# Patient Record
Sex: Male | Born: 1935 | Race: White | Hispanic: No | Marital: Married | Smoking: Never smoker
Health system: Southern US, Community
[De-identification: ages and names within clinical notes are randomized; demographics above are authoritative.]

## PROBLEM LIST (undated history)

## (undated) DIAGNOSIS — Z923 Personal history of irradiation: Secondary | ICD-10-CM

## (undated) DIAGNOSIS — C61 Malignant neoplasm of prostate: Secondary | ICD-10-CM

## (undated) HISTORY — PX: STOMACH SURGERY: SHX791

## (undated) HISTORY — PX: TONSILLECTOMY: SUR1361

## (undated) HISTORY — PX: TOOTH EXTRACTION: SUR596

## (undated) HISTORY — PX: WISDOM TOOTH EXTRACTION: SHX21

---

## 2002-11-25 ENCOUNTER — Ambulatory Visit: Admission: RE | Admit: 2002-11-25 | Discharge: 2003-02-23 | Payer: Self-pay | Admitting: Radiation Oncology

## 2003-01-09 ENCOUNTER — Encounter: Admission: RE | Admit: 2003-01-09 | Discharge: 2003-01-09 | Payer: Self-pay | Admitting: Urology

## 2003-01-09 ENCOUNTER — Encounter: Payer: Self-pay | Admitting: Urology

## 2003-02-12 ENCOUNTER — Ambulatory Visit (HOSPITAL_BASED_OUTPATIENT_CLINIC_OR_DEPARTMENT_OTHER): Admission: RE | Admit: 2003-02-12 | Discharge: 2003-02-12 | Payer: Self-pay | Admitting: Urology

## 2003-02-12 ENCOUNTER — Encounter: Payer: Self-pay | Admitting: Urology

## 2003-02-26 ENCOUNTER — Ambulatory Visit: Admission: RE | Admit: 2003-02-26 | Discharge: 2003-02-26 | Payer: Self-pay | Admitting: Radiation Oncology

## 2003-03-02 ENCOUNTER — Ambulatory Visit: Admission: RE | Admit: 2003-03-02 | Discharge: 2003-03-11 | Payer: Self-pay | Admitting: Radiation Oncology

## 2009-12-14 ENCOUNTER — Ambulatory Visit: Payer: Self-pay | Admitting: Internal Medicine

## 2009-12-14 ENCOUNTER — Inpatient Hospital Stay (HOSPITAL_COMMUNITY): Admission: EM | Admit: 2009-12-14 | Discharge: 2009-12-29 | Payer: Self-pay | Admitting: Emergency Medicine

## 2009-12-14 ENCOUNTER — Ambulatory Visit: Payer: Self-pay | Admitting: Pulmonary Disease

## 2009-12-15 ENCOUNTER — Ambulatory Visit: Payer: Self-pay | Admitting: Vascular Surgery

## 2009-12-15 ENCOUNTER — Encounter (INDEPENDENT_AMBULATORY_CARE_PROVIDER_SITE_OTHER): Payer: Self-pay | Admitting: Internal Medicine

## 2009-12-17 ENCOUNTER — Encounter (INDEPENDENT_AMBULATORY_CARE_PROVIDER_SITE_OTHER): Payer: Self-pay | Admitting: Internal Medicine

## 2010-02-14 ENCOUNTER — Inpatient Hospital Stay (HOSPITAL_COMMUNITY): Admission: EM | Admit: 2010-02-14 | Discharge: 2010-02-20 | Payer: Self-pay | Admitting: Emergency Medicine

## 2010-02-23 ENCOUNTER — Encounter: Admission: RE | Admit: 2010-02-23 | Discharge: 2010-02-23 | Payer: Self-pay | Admitting: General Surgery

## 2010-02-25 ENCOUNTER — Emergency Department (HOSPITAL_COMMUNITY): Admission: EM | Admit: 2010-02-25 | Discharge: 2010-02-26 | Payer: Self-pay | Admitting: Emergency Medicine

## 2010-03-16 ENCOUNTER — Ambulatory Visit (HOSPITAL_COMMUNITY): Admission: RE | Admit: 2010-03-16 | Discharge: 2010-03-16 | Payer: Self-pay | Admitting: Gastroenterology

## 2010-03-28 ENCOUNTER — Encounter: Admission: RE | Admit: 2010-03-28 | Discharge: 2010-03-28 | Payer: Self-pay | Admitting: Gastroenterology

## 2010-04-01 ENCOUNTER — Inpatient Hospital Stay (HOSPITAL_COMMUNITY): Admission: RE | Admit: 2010-04-01 | Discharge: 2010-04-08 | Payer: Self-pay | Admitting: General Surgery

## 2010-04-01 ENCOUNTER — Encounter (INDEPENDENT_AMBULATORY_CARE_PROVIDER_SITE_OTHER): Payer: Self-pay | Admitting: General Surgery

## 2010-12-03 LAB — COMPREHENSIVE METABOLIC PANEL
Alkaline Phosphatase: 72 U/L (ref 39–117)
BUN: 6 mg/dL (ref 6–23)
Calcium: 8.1 mg/dL — ABNORMAL LOW (ref 8.4–10.5)
Glucose, Bld: 131 mg/dL — ABNORMAL HIGH (ref 70–99)
Potassium: 3.4 mEq/L — ABNORMAL LOW (ref 3.5–5.1)
Total Protein: 6 g/dL (ref 6.0–8.3)

## 2010-12-03 LAB — DIFFERENTIAL
Basophils Absolute: 0.1 10*3/uL (ref 0.0–0.1)
Basophils Relative: 1 % (ref 0–1)
Monocytes Relative: 8 % (ref 3–12)
Neutro Abs: 5.9 10*3/uL (ref 1.7–7.7)
Neutrophils Relative %: 73 % (ref 43–77)

## 2010-12-03 LAB — CBC
Platelets: 266 10*3/uL (ref 150–400)
RDW: 14.1 % (ref 11.5–15.5)

## 2010-12-04 LAB — BASIC METABOLIC PANEL
GFR calc Af Amer: >60 mL/min (ref >60)
GFR calc non Af Amer: >60 mL/min (ref >60)
Glucose, Bld: 127 mg/dL — ABNORMAL HIGH (ref 70–99)
Potassium: 4 mEq/L (ref 3.5–5.1)
Sodium: 134 mEq/L — ABNORMAL LOW (ref 135–145)

## 2010-12-04 LAB — SURGICAL PCR SCREEN: MRSA, PCR: POSITIVE — AB

## 2010-12-04 LAB — CBC
HCT: 29.9 % — ABNORMAL LOW (ref 39.0–52.0)
Hemoglobin: 10.1 g/dL — ABNORMAL LOW (ref 13.0–17.0)
RBC: 3.4 MIL/uL — ABNORMAL LOW (ref 4.22–5.81)
WBC: 7.9 10*3/uL (ref 4.0–10.5)

## 2010-12-05 LAB — URINALYSIS, ROUTINE W REFLEX MICROSCOPIC
Glucose, UA: NEGATIVE mg/dL
Glucose, UA: 100 mg/dL — AB
Hgb urine dipstick: NEGATIVE
Ketones, ur: NEGATIVE mg/dL
Leukocytes, UA: NEGATIVE
Nitrite: NEGATIVE
pH: 6 (ref 5.0–8.0)
pH: 6.5 (ref 5.0–8.0)

## 2010-12-05 LAB — DIFFERENTIAL
Basophils Absolute: 0 10*3/uL (ref 0.0–0.1)
Basophils Relative: 1 % (ref 0–1)
Eosinophils Relative: 0 % (ref 0–5)
Lymphocytes Relative: 11 % — ABNORMAL LOW (ref 12–46)
Lymphocytes Relative: 11 % — ABNORMAL LOW (ref 12–46)
Lymphs Abs: 1.5 10*3/uL (ref 0.7–4.0)
Lymphs Abs: 1.5 10*3/uL (ref 0.7–4.0)
Monocytes Absolute: 0.4 10*3/uL (ref 0.1–1.0)
Monocytes Absolute: 0.8 10*3/uL (ref 0.1–1.0)
Monocytes Absolute: 0.9 10*3/uL (ref 0.1–1.0)
Monocytes Relative: 5 % (ref 3–12)
Monocytes Relative: 7 % (ref 3–12)
Neutro Abs: 11.3 10*3/uL — ABNORMAL HIGH (ref 1.7–7.7)
Neutro Abs: 11 10*3/uL — ABNORMAL HIGH (ref 1.7–7.7)
Neutro Abs: 14.2 10*3/uL — ABNORMAL HIGH (ref 1.7–7.7)

## 2010-12-05 LAB — COMPREHENSIVE METABOLIC PANEL
ALT: 14 U/L (ref 0–53)
ALT: 14 U/L (ref 0–53)
ALT: 15 U/L (ref 0–53)
AST: 20 U/L (ref 0–37)
AST: 23 U/L (ref 0–37)
AST: 25 U/L (ref 0–37)
Albumin: 2.1 g/dL — ABNORMAL LOW (ref 3.5–5.2)
Albumin: 2.4 g/dL — ABNORMAL LOW (ref 3.5–5.2)
Albumin: 3.2 g/dL — ABNORMAL LOW (ref 3.5–5.2)
Alkaline Phosphatase: 63 U/L (ref 39–117)
Alkaline Phosphatase: 70 U/L (ref 39–117)
BUN: 12 mg/dL (ref 6–23)
BUN: 24 mg/dL — ABNORMAL HIGH (ref 6–23)
BUN: 18 mg/dL (ref 6–23)
BUN: 18 mg/dL (ref 6–23)
CO2: 22 mEq/L (ref 19–32)
CO2: 22 mEq/L (ref 19–32)
Calcium: 7.8 mg/dL — ABNORMAL LOW (ref 8.4–10.5)
Calcium: 8 mg/dL — ABNORMAL LOW (ref 8.4–10.5)
Chloride: 108 mEq/L (ref 96–112)
Chloride: 110 mEq/L (ref 96–112)
Creatinine, Ser: 0.79 mg/dL (ref 0.4–1.5)
Creatinine, Ser: 0.95 mg/dL (ref 0.4–1.5)
GFR calc Af Amer: >60 mL/min (ref >60)
GFR calc Af Amer: >60 mL/min (ref >60)
GFR calc Af Amer: >60 mL/min (ref >60)
GFR calc non Af Amer: >60 mL/min (ref >60)
GFR calc non Af Amer: >60 mL/min (ref >60)
Glucose, Bld: 106 mg/dL — ABNORMAL HIGH (ref 70–99)
Glucose, Bld: 98 mg/dL (ref 70–99)
Potassium: 3.6 mEq/L (ref 3.5–5.1)
Potassium: 3.8 mEq/L (ref 3.5–5.1)
Sodium: 139 mEq/L (ref 135–145)
Sodium: 140 mEq/L (ref 135–145)
Sodium: 137 mEq/L (ref 135–145)
Total Bilirubin: 1.5 mg/dL — ABNORMAL HIGH (ref 0.3–1.2)
Total Protein: 6.1 g/dL (ref 6.0–8.3)
Total Protein: 5.8 g/dL — ABNORMAL LOW (ref 6.0–8.3)
Total Protein: 7.3 g/dL (ref 6.0–8.3)

## 2010-12-05 LAB — CBC
HCT: 26.8 % — ABNORMAL LOW (ref 39.0–52.0)
HCT: 27.8 % — ABNORMAL LOW (ref 39.0–52.0)
HCT: 29.9 % — ABNORMAL LOW (ref 39.0–52.0)
HCT: 29.2 % — ABNORMAL LOW (ref 39.0–52.0)
Hemoglobin: 10.4 g/dL — ABNORMAL LOW (ref 13.0–17.0)
Hemoglobin: 9.2 g/dL — ABNORMAL LOW (ref 13.0–17.0)
Hemoglobin: 9.5 g/dL — ABNORMAL LOW (ref 13.0–17.0)
MCHC: 34.3 g/dL (ref 30.0–36.0)
MCHC: 34.5 g/dL (ref 30.0–36.0)
MCV: 92 fL (ref 78.0–100.0)
MCV: 91.9 fL (ref 78.0–100.0)
Platelets: 151 10*3/uL (ref 150–400)
Platelets: 162 10*3/uL (ref 150–400)
Platelets: 393 10*3/uL (ref 150–400)
Platelets: 167 10*3/uL (ref 150–400)
Platelets: 235 10*3/uL (ref 150–400)
RBC: 2.95 MIL/uL — ABNORMAL LOW (ref 4.22–5.81)
RBC: 3.02 MIL/uL — ABNORMAL LOW (ref 4.22–5.81)
RDW: 12.9 % (ref 11.5–15.5)
RDW: 13.7 % (ref 11.5–15.5)
RDW: 12.9 % (ref 11.5–15.5)
RDW: 12.9 % (ref 11.5–15.5)
WBC: 11.4 10*3/uL — ABNORMAL HIGH (ref 4.0–10.5)
WBC: 13.3 10*3/uL — ABNORMAL HIGH (ref 4.0–10.5)
WBC: 8.1 10*3/uL (ref 4.0–10.5)

## 2010-12-05 LAB — URINE CULTURE: Colony Count: NO GROWTH

## 2010-12-05 LAB — BASIC METABOLIC PANEL
BUN: 18 mg/dL (ref 6–23)
CO2: 21 mEq/L (ref 19–32)
CO2: 26 mEq/L (ref 19–32)
Chloride: 101 mEq/L (ref 96–112)
GFR calc Af Amer: >60 mL/min (ref >60)
GFR calc non Af Amer: >60 mL/min (ref >60)
Glucose, Bld: 102 mg/dL — ABNORMAL HIGH (ref 70–99)
Potassium: 3 mEq/L — ABNORMAL LOW (ref 3.5–5.1)
Potassium: 3.2 mEq/L — ABNORMAL LOW (ref 3.5–5.1)
Sodium: 137 mEq/L (ref 135–145)

## 2010-12-05 LAB — LIPASE, BLOOD: Lipase: 74 U/L — ABNORMAL HIGH (ref 11–59)

## 2010-12-05 LAB — LIPID PANEL
HDL: 22 mg/dL — ABNORMAL LOW (ref >39)
Total CHOL/HDL Ratio: 3 RATIO
Triglycerides: 46 mg/dL (ref <150)
VLDL: 9 mg/dL (ref 0–40)

## 2010-12-05 LAB — CULTURE, BLOOD (ROUTINE X 2): Culture: NO GROWTH

## 2010-12-05 LAB — URINE MICROSCOPIC-ADD ON

## 2010-12-07 LAB — GLUCOSE, CAPILLARY
Glucose-Capillary: 103 mg/dL — ABNORMAL HIGH (ref 70–99)
Glucose-Capillary: 110 mg/dL — ABNORMAL HIGH (ref 70–99)
Glucose-Capillary: 110 mg/dL — ABNORMAL HIGH (ref 70–99)
Glucose-Capillary: 111 mg/dL — ABNORMAL HIGH (ref 70–99)
Glucose-Capillary: 116 mg/dL — ABNORMAL HIGH (ref 70–99)
Glucose-Capillary: 117 mg/dL — ABNORMAL HIGH (ref 70–99)
Glucose-Capillary: 119 mg/dL — ABNORMAL HIGH (ref 70–99)
Glucose-Capillary: 122 mg/dL — ABNORMAL HIGH (ref 70–99)
Glucose-Capillary: 134 mg/dL — ABNORMAL HIGH (ref 70–99)
Glucose-Capillary: 139 mg/dL — ABNORMAL HIGH (ref 70–99)
Glucose-Capillary: 160 mg/dL — ABNORMAL HIGH (ref 70–99)
Glucose-Capillary: 166 mg/dL — ABNORMAL HIGH (ref 70–99)
Glucose-Capillary: 166 mg/dL — ABNORMAL HIGH (ref 70–99)
Glucose-Capillary: 170 mg/dL — ABNORMAL HIGH (ref 70–99)
Glucose-Capillary: 186 mg/dL — ABNORMAL HIGH (ref 70–99)
Glucose-Capillary: 190 mg/dL — ABNORMAL HIGH (ref 70–99)
Glucose-Capillary: 191 mg/dL — ABNORMAL HIGH (ref 70–99)
Glucose-Capillary: 195 mg/dL — ABNORMAL HIGH (ref 70–99)
Glucose-Capillary: 199 mg/dL — ABNORMAL HIGH (ref 70–99)
Glucose-Capillary: 214 mg/dL — ABNORMAL HIGH (ref 70–99)
Glucose-Capillary: 241 mg/dL — ABNORMAL HIGH (ref 70–99)
Glucose-Capillary: 272 mg/dL — ABNORMAL HIGH (ref 70–99)
Glucose-Capillary: 70 mg/dL (ref 70–99)

## 2010-12-07 LAB — BASIC METABOLIC PANEL
BUN: 21 mg/dL (ref 6–23)
BUN: 47 mg/dL — ABNORMAL HIGH (ref 6–23)
CO2: 23 mEq/L (ref 19–32)
CO2: 25 mEq/L (ref 19–32)
Calcium: 7.1 mg/dL — ABNORMAL LOW (ref 8.4–10.5)
Calcium: 7.2 mg/dL — ABNORMAL LOW (ref 8.4–10.5)
Calcium: 7.3 mg/dL — ABNORMAL LOW (ref 8.4–10.5)
Calcium: 7.3 mg/dL — ABNORMAL LOW (ref 8.4–10.5)
Chloride: 111 mEq/L (ref 96–112)
Chloride: 115 mEq/L — ABNORMAL HIGH (ref 96–112)
Chloride: 116 mEq/L — ABNORMAL HIGH (ref 96–112)
Creatinine, Ser: 1.27 mg/dL (ref 0.4–1.5)
Creatinine, Ser: 1.31 mg/dL (ref 0.4–1.5)
Creatinine, Ser: 1.54 mg/dL — ABNORMAL HIGH (ref 0.4–1.5)
Creatinine, Ser: 1.75 mg/dL — ABNORMAL HIGH (ref 0.4–1.5)
GFR calc Af Amer: 46 mL/min — ABNORMAL LOW (ref >60)
GFR calc Af Amer: 52 mL/min — ABNORMAL LOW (ref >60)
GFR calc Af Amer: 54 mL/min — ABNORMAL LOW (ref >60)
GFR calc Af Amer: 56 mL/min — ABNORMAL LOW (ref >60)
GFR calc Af Amer: 59 mL/min — ABNORMAL LOW (ref >60)
GFR calc Af Amer: >60 mL/min (ref >60)
GFR calc non Af Amer: 44 mL/min — ABNORMAL LOW (ref >60)
GFR calc non Af Amer: 46 mL/min — ABNORMAL LOW (ref >60)
GFR calc non Af Amer: 49 mL/min — ABNORMAL LOW (ref >60)
GFR calc non Af Amer: 55 mL/min — ABNORMAL LOW (ref >60)
Glucose, Bld: 153 mg/dL — ABNORMAL HIGH (ref 70–99)
Glucose, Bld: 157 mg/dL — ABNORMAL HIGH (ref 70–99)
Glucose, Bld: 163 mg/dL — ABNORMAL HIGH (ref 70–99)
Glucose, Bld: 205 mg/dL — ABNORMAL HIGH (ref 70–99)
Glucose, Bld: 267 mg/dL — ABNORMAL HIGH (ref 70–99)
Potassium: 3.1 mEq/L — ABNORMAL LOW (ref 3.5–5.1)
Potassium: 3.1 mEq/L — ABNORMAL LOW (ref 3.5–5.1)
Potassium: 3.4 mEq/L — ABNORMAL LOW (ref 3.5–5.1)
Sodium: 144 mEq/L (ref 135–145)
Sodium: 145 mEq/L (ref 135–145)

## 2010-12-07 LAB — CBC
HCT: 35.3 % — ABNORMAL LOW (ref 39.0–52.0)
HCT: 39.9 % (ref 39.0–52.0)
Hemoglobin: 12.4 g/dL — ABNORMAL LOW (ref 13.0–17.0)
Hemoglobin: 13.2 g/dL (ref 13.0–17.0)
Hemoglobin: 13.9 g/dL (ref 13.0–17.0)
MCHC: 33.6 g/dL (ref 30.0–36.0)
MCHC: 33.6 g/dL (ref 30.0–36.0)
MCHC: 33.8 g/dL (ref 30.0–36.0)
MCHC: 34 g/dL (ref 30.0–36.0)
MCHC: 34.6 g/dL (ref 30.0–36.0)
MCV: 93.3 fL (ref 78.0–100.0)
MCV: 93.6 fL (ref 78.0–100.0)
MCV: 93.7 fL (ref 78.0–100.0)
MCV: 94.1 fL (ref 78.0–100.0)
MCV: 95.3 fL (ref 78.0–100.0)
Platelets: 213 10*3/uL (ref 150–400)
Platelets: 221 10*3/uL (ref 150–400)
Platelets: 237 10*3/uL (ref 150–400)
Platelets: 292 10*3/uL (ref 150–400)
RBC: 3.74 MIL/uL — ABNORMAL LOW (ref 4.22–5.81)
RBC: 3.77 MIL/uL — ABNORMAL LOW (ref 4.22–5.81)
RBC: 3.95 MIL/uL — ABNORMAL LOW (ref 4.22–5.81)
RBC: 3.97 MIL/uL — ABNORMAL LOW (ref 4.22–5.81)
RBC: 4.16 MIL/uL — ABNORMAL LOW (ref 4.22–5.81)
RBC: 4.26 MIL/uL (ref 4.22–5.81)
RBC: 4.69 MIL/uL (ref 4.22–5.81)
RBC: 4.99 MIL/uL (ref 4.22–5.81)
RDW: 13.2 % (ref 11.5–15.5)
RDW: 13.5 % (ref 11.5–15.5)
RDW: 13.6 % (ref 11.5–15.5)
RDW: 13.6 % (ref 11.5–15.5)
RDW: 13.7 % (ref 11.5–15.5)
RDW: 13.9 % (ref 11.5–15.5)
RDW: 14.1 % (ref 11.5–15.5)
WBC: 15.9 10*3/uL — ABNORMAL HIGH (ref 4.0–10.5)
WBC: 23.1 10*3/uL — ABNORMAL HIGH (ref 4.0–10.5)
WBC: 24 10*3/uL — ABNORMAL HIGH (ref 4.0–10.5)
WBC: 31.5 10*3/uL — ABNORMAL HIGH (ref 4.0–10.5)

## 2010-12-07 LAB — CLOSTRIDIUM DIFFICILE EIA: C difficile Toxins A+B, EIA: NEGATIVE

## 2010-12-07 LAB — COMPREHENSIVE METABOLIC PANEL
ALT: 33 U/L (ref 0–53)
AST: 28 U/L (ref 0–37)
AST: 38 U/L — ABNORMAL HIGH (ref 0–37)
Alkaline Phosphatase: 111 U/L (ref 39–117)
Alkaline Phosphatase: 93 U/L (ref 39–117)
BUN: 14 mg/dL (ref 6–23)
CO2: 27 mEq/L (ref 19–32)
CO2: 27 mEq/L (ref 19–32)
CO2: 27 mEq/L (ref 19–32)
Calcium: 7.1 mg/dL — ABNORMAL LOW (ref 8.4–10.5)
Calcium: 7.7 mg/dL — ABNORMAL LOW (ref 8.4–10.5)
Chloride: 105 mEq/L (ref 96–112)
Chloride: 108 mEq/L (ref 96–112)
Creatinine, Ser: 1.37 mg/dL (ref 0.4–1.5)
GFR calc Af Amer: >60 mL/min (ref >60)
GFR calc Af Amer: >60 mL/min (ref >60)
GFR calc non Af Amer: 51 mL/min — ABNORMAL LOW (ref >60)
GFR calc non Af Amer: 53 mL/min — ABNORMAL LOW (ref >60)
GFR calc non Af Amer: 56 mL/min — ABNORMAL LOW (ref >60)
Glucose, Bld: 110 mg/dL — ABNORMAL HIGH (ref 70–99)
Glucose, Bld: 94 mg/dL (ref 70–99)
Potassium: 2.8 mEq/L — ABNORMAL LOW (ref 3.5–5.1)
Potassium: 3.2 mEq/L — ABNORMAL LOW (ref 3.5–5.1)
Sodium: 138 mEq/L (ref 135–145)
Sodium: 140 mEq/L (ref 135–145)
Total Bilirubin: 0.9 mg/dL (ref 0.3–1.2)
Total Bilirubin: 1 mg/dL (ref 0.3–1.2)
Total Protein: 5.3 g/dL — ABNORMAL LOW (ref 6.0–8.3)

## 2010-12-07 LAB — URINALYSIS, ROUTINE W REFLEX MICROSCOPIC
Bilirubin Urine: NEGATIVE
Glucose, UA: NEGATIVE mg/dL
Nitrite: NEGATIVE
Protein, ur: 30 mg/dL — AB
Specific Gravity, Urine: 1.026 (ref 1.005–1.030)
Urobilinogen, UA: 0.2 mg/dL (ref 0.0–1.0)
pH: 5.5 (ref 5.0–8.0)

## 2010-12-07 LAB — CARDIAC PANEL(CRET KIN+CKTOT+MB+TROPI)
CK, MB: 2.1 ng/mL (ref 0.3–4.0)
CK, MB: 2.1 ng/mL (ref 0.3–4.0)
Relative Index: 1.5 (ref 0.0–2.5)
Relative Index: 1.9 (ref 0.0–2.5)
Total CK: 113 U/L (ref 7–232)
Total CK: 149 U/L (ref 7–232)
Troponin I: 0.02 ng/mL (ref 0.00–0.06)
Troponin I: 0.03 ng/mL (ref 0.00–0.06)
Troponin I: 0.03 ng/mL (ref 0.00–0.06)
Troponin I: 0.04 ng/mL (ref 0.00–0.06)

## 2010-12-07 LAB — URINE MICROSCOPIC-ADD ON

## 2010-12-07 LAB — URINE CULTURE
Culture: NO GROWTH
Special Requests: NEGATIVE

## 2010-12-07 LAB — HEPATIC FUNCTION PANEL
AST: 24 U/L (ref 0–37)
Albumin: 2 g/dL — ABNORMAL LOW (ref 3.5–5.2)
Albumin: 2.3 g/dL — ABNORMAL LOW (ref 3.5–5.2)
Total Bilirubin: 1 mg/dL (ref 0.3–1.2)
Total Protein: 5.5 g/dL — ABNORMAL LOW (ref 6.0–8.3)

## 2010-12-07 LAB — MAGNESIUM
Magnesium: 1.9 mg/dL (ref 1.5–2.5)
Magnesium: 2.2 mg/dL (ref 1.5–2.5)
Magnesium: 2.2 mg/dL (ref 1.5–2.5)

## 2010-12-07 LAB — OSMOLALITY, URINE: Osmolality, Ur: 641 mOsm/kg (ref 390–1090)

## 2010-12-07 LAB — SODIUM, URINE, RANDOM
Sodium, Ur: 13 mEq/L
Sodium, Ur: 15 mEq/L

## 2010-12-07 LAB — BRAIN NATRIURETIC PEPTIDE: Pro B Natriuretic peptide (BNP): <30 pg/mL (ref 0.0–100.0)

## 2010-12-07 LAB — POTASSIUM: Potassium: 3.1 mEq/L — ABNORMAL LOW (ref 3.5–5.1)

## 2010-12-07 LAB — CREATININE, URINE, RANDOM: Creatinine, Urine: 112.4 mg/dL

## 2010-12-07 LAB — MRSA PCR SCREENING: MRSA by PCR: NEGATIVE

## 2010-12-07 LAB — AMYLASE: Amylase: 70 U/L (ref 0–105)

## 2010-12-07 LAB — LIPASE, BLOOD
Lipase: 36 U/L (ref 11–59)
Lipase: 43 U/L (ref 11–59)

## 2010-12-07 LAB — HEMOGLOBIN A1C: Hgb A1c MFr Bld: 6.8 % — ABNORMAL HIGH (ref 4.6–6.1)

## 2010-12-11 LAB — PROTIME-INR: INR: 1.08 (ref 0.00–1.49)

## 2010-12-11 LAB — POCT I-STAT, CHEM 8
BUN: 14 mg/dL (ref 6–23)
Creatinine, Ser: 1.2 mg/dL (ref 0.4–1.5)
Glucose, Bld: 195 mg/dL — ABNORMAL HIGH (ref 70–99)
Sodium: 141 mEq/L (ref 135–145)
TCO2: 28 mmol/L (ref 0–100)

## 2010-12-11 LAB — DIFFERENTIAL
Basophils Relative: 0 % (ref 0–1)
Basophils Relative: 0 % (ref 0–1)
Eosinophils Absolute: 0 10*3/uL (ref 0.0–0.7)
Eosinophils Absolute: 0 10*3/uL (ref 0.0–0.7)
Lymphs Abs: 1.4 10*3/uL (ref 0.7–4.0)
Monocytes Absolute: 0.5 10*3/uL (ref 0.1–1.0)
Monocytes Relative: 4 % (ref 3–12)
Monocytes Relative: 5 % (ref 3–12)
Neutrophils Relative %: 86 % — ABNORMAL HIGH (ref 43–77)
Neutrophils Relative %: 92 % — ABNORMAL HIGH (ref 43–77)

## 2010-12-11 LAB — COMPREHENSIVE METABOLIC PANEL
ALT: 215 U/L — ABNORMAL HIGH (ref 0–53)
ALT: 322 U/L — ABNORMAL HIGH (ref 0–53)
ALT: 346 U/L — ABNORMAL HIGH (ref 0–53)
AST: 116 U/L — ABNORMAL HIGH (ref 0–37)
AST: 400 U/L — ABNORMAL HIGH (ref 0–37)
Albumin: 3 g/dL — ABNORMAL LOW (ref 3.5–5.2)
Albumin: 4.2 g/dL (ref 3.5–5.2)
Alkaline Phosphatase: 115 U/L (ref 39–117)
Alkaline Phosphatase: 119 U/L — ABNORMAL HIGH (ref 39–117)
Alkaline Phosphatase: 134 U/L — ABNORMAL HIGH (ref 39–117)
BUN: 28 mg/dL — ABNORMAL HIGH (ref 6–23)
CO2: 24 mEq/L (ref 19–32)
Calcium: 9.4 mg/dL (ref 8.4–10.5)
Chloride: 109 mEq/L (ref 96–112)
Chloride: 111 mEq/L (ref 96–112)
GFR calc Af Amer: >60 mL/min (ref >60)
GFR calc Af Amer: >60 mL/min (ref >60)
GFR calc non Af Amer: >60 mL/min (ref >60)
Glucose, Bld: 191 mg/dL — ABNORMAL HIGH (ref 70–99)
Glucose, Bld: 199 mg/dL — ABNORMAL HIGH (ref 70–99)
Potassium: 3.6 mEq/L (ref 3.5–5.1)
Potassium: 3.8 mEq/L (ref 3.5–5.1)
Potassium: 3.9 mEq/L (ref 3.5–5.1)
Sodium: 140 mEq/L (ref 135–145)
Sodium: 141 mEq/L (ref 135–145)
Total Bilirubin: 1.7 mg/dL — ABNORMAL HIGH (ref 0.3–1.2)
Total Bilirubin: 2.8 mg/dL — ABNORMAL HIGH (ref 0.3–1.2)
Total Protein: 6.4 g/dL (ref 6.0–8.3)
Total Protein: 7.5 g/dL (ref 6.0–8.3)

## 2010-12-11 LAB — POCT CARDIAC MARKERS
CKMB, poc: 1.1 ng/mL (ref 1.0–8.0)
Myoglobin, poc: 128 ng/mL (ref 12–200)

## 2010-12-11 LAB — CBC
Hemoglobin: 16.3 g/dL (ref 13.0–17.0)
MCHC: 33.7 g/dL (ref 30.0–36.0)
MCHC: 33.8 g/dL (ref 30.0–36.0)
MCV: 95.4 fL (ref 78.0–100.0)
Platelets: 167 10*3/uL (ref 150–400)
Platelets: 197 10*3/uL (ref 150–400)
RBC: 5.11 MIL/uL (ref 4.22–5.81)
RDW: 13.1 % (ref 11.5–15.5)
RDW: 13.8 % (ref 11.5–15.5)

## 2010-12-11 LAB — URINALYSIS, ROUTINE W REFLEX MICROSCOPIC
Bilirubin Urine: NEGATIVE
Hgb urine dipstick: NEGATIVE
Nitrite: NEGATIVE
Protein, ur: NEGATIVE mg/dL
Urobilinogen, UA: 2 mg/dL — ABNORMAL HIGH (ref 0.0–1.0)

## 2010-12-11 LAB — LIPID PANEL: HDL: 38 mg/dL — ABNORMAL LOW (ref >39)

## 2010-12-11 LAB — GLUCOSE, CAPILLARY: Glucose-Capillary: 179 mg/dL — ABNORMAL HIGH (ref 70–99)

## 2010-12-11 LAB — TROPONIN I: Troponin I: 0.01 ng/mL (ref 0.00–0.06)

## 2010-12-11 LAB — CK TOTAL AND CKMB (NOT AT ARMC)
CK, MB: 1.9 ng/mL (ref 0.3–4.0)
Relative Index: INVALID (ref 0.0–2.5)
Total CK: 89 U/L (ref 7–232)

## 2010-12-11 LAB — LIPASE, BLOOD: Lipase: 602 U/L — ABNORMAL HIGH (ref 11–59)

## 2010-12-11 LAB — APTT: aPTT: 22 seconds — ABNORMAL LOW (ref 24–37)

## 2010-12-11 LAB — CARDIAC PANEL(CRET KIN+CKTOT+MB+TROPI)
CK, MB: 1.5 ng/mL (ref 0.3–4.0)
CK, MB: 1.6 ng/mL (ref 0.3–4.0)
Relative Index: INVALID (ref 0.0–2.5)
Total CK: 73 U/L (ref 7–232)
Troponin I: 0.03 ng/mL (ref 0.00–0.06)

## 2010-12-11 LAB — URINALYSIS, DIPSTICK ONLY
Glucose, UA: NEGATIVE mg/dL
pH: 5.5 (ref 5.0–8.0)

## 2010-12-11 LAB — LACTATE DEHYDROGENASE: LDH: 474 U/L — ABNORMAL HIGH (ref 94–250)

## 2010-12-11 LAB — BRAIN NATRIURETIC PEPTIDE: Pro B Natriuretic peptide (BNP): 31.8 pg/mL (ref 0.0–100.0)

## 2010-12-11 LAB — PSA: PSA: 0.03 ng/mL — ABNORMAL LOW (ref 0.10–4.00)

## 2010-12-11 LAB — HEMOGLOBIN A1C: Mean Plasma Glucose: 134 mg/dL

## 2010-12-11 LAB — HEPATITIS PANEL, ACUTE: Hepatitis B Surface Ag: NEGATIVE

## 2011-01-25 IMAGING — CR DG CHEST 1V PORT
1 series · 1 of 1 positions shown · non-contrast
Comparison: 12/18/2009

CLINICAL DATA: There is of breath.

PORTABLE CHEST - 1 VIEW

[view not recorded]
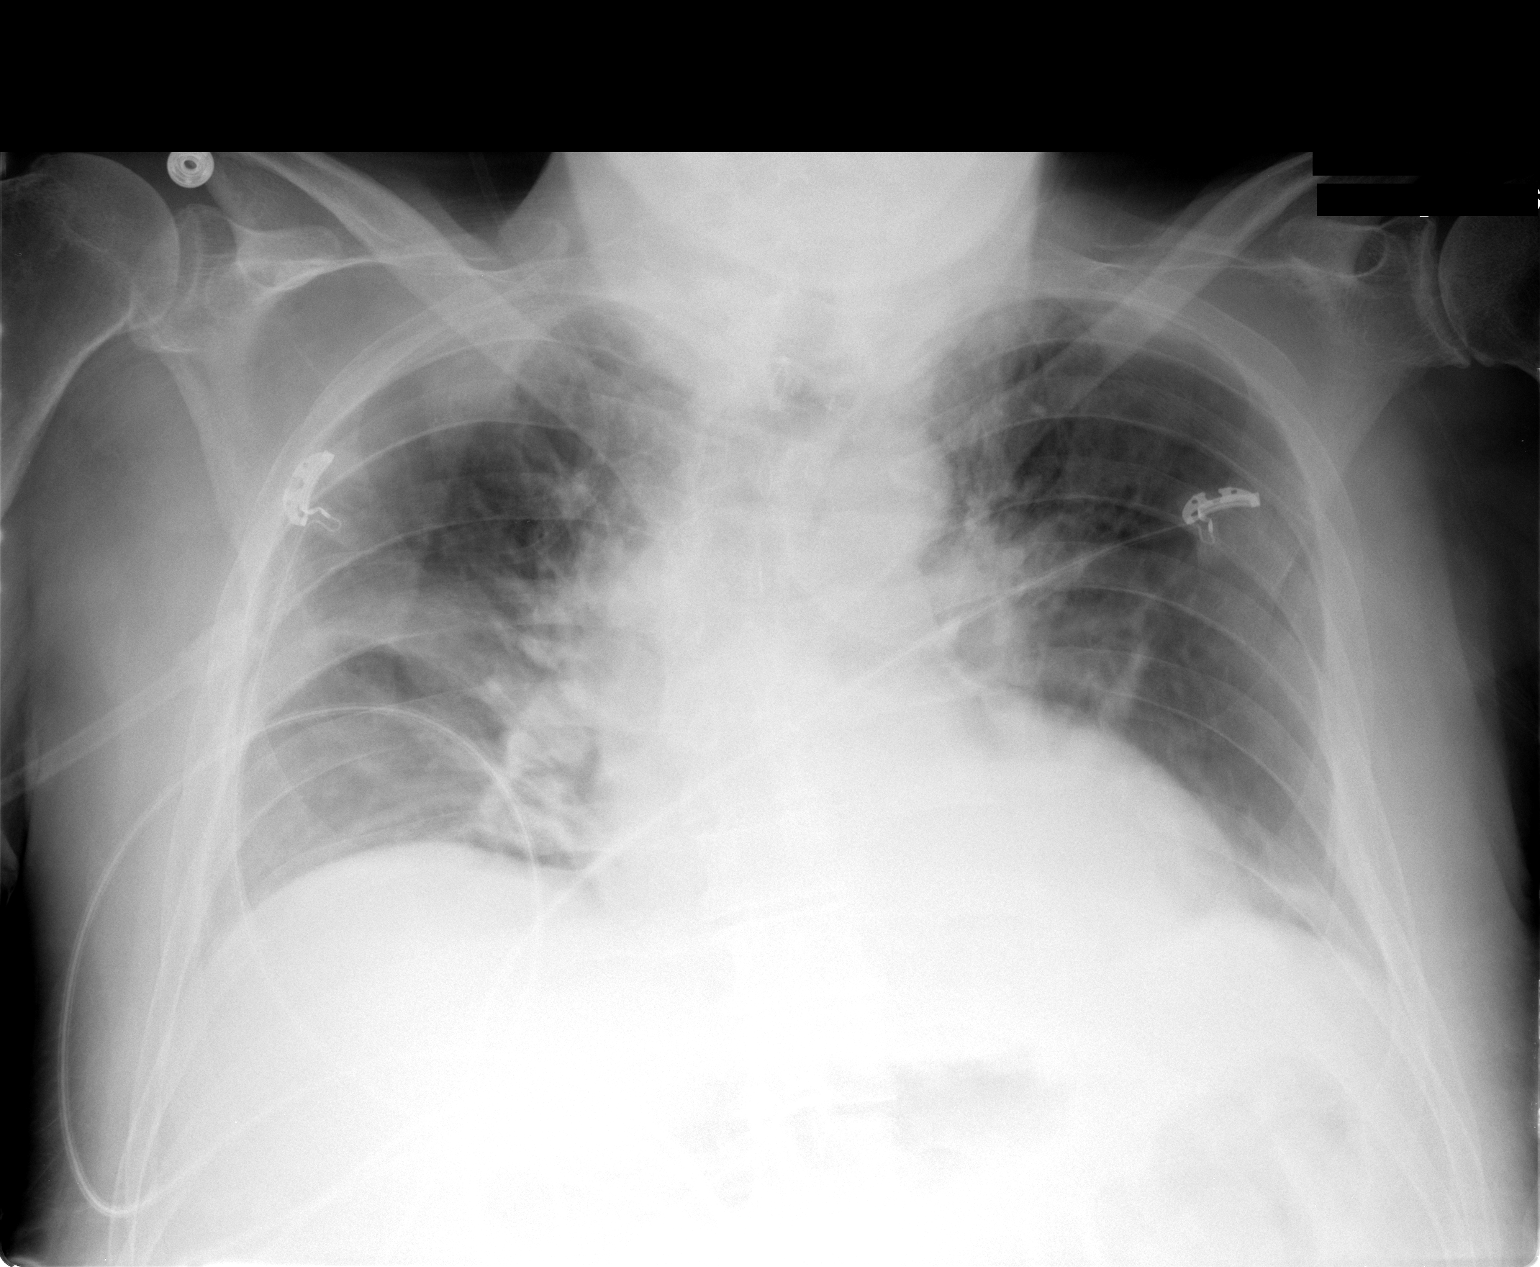

[1 of 1 positions shown; findings below may reference images not displayed]

FINDINGS: 0044 hours.  Lung volumes are low. The cardiopericardial
silhouette is enlarged. Bibasilar collapse / consolidation
persists.  Right pleural effusion again noted. Telemetry leads
overlie the chest.
IMPRESSION: No substantial change.  Low lung volumes with cardiomegaly and
bibasilar collapse / consolidation.

Probable small right pleural effusion.

## 2011-01-28 IMAGING — CT CT ABD-PELV W/O CM
1 of 2 series · 13 of 32 positions shown, 19 images · non-contrast
Comparison: 12/14/2009.

CLINICAL DATA: 76-year-old male with abdominal pain, nausea,
vomiting, leukocytosis.  History pancreatitis and prostate cancer.

CT ABDOMEN AND PELVIS WITHOUT CONTRAST
TECHNIQUE: Multidetector CT imaging of the abdomen and pelvis was
performed following the standard protocol without intravenous
contrast.

[Series 2: rtn ap without · axial · non-contrast · 0.79mm/px · z∈[-430,+26]mm · 13 of 107 slices shown, 19 images]
[im 8/107  soft-tissue]
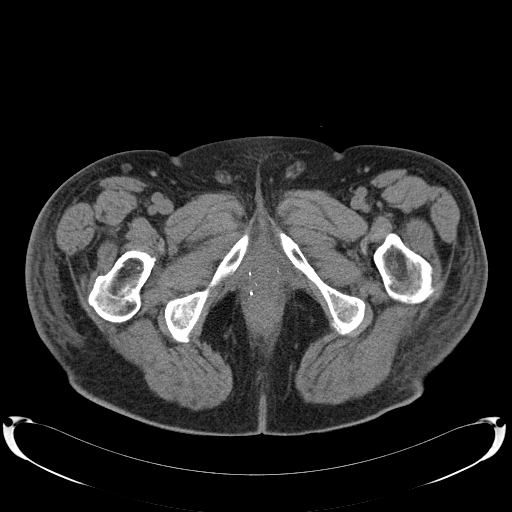
[im 8/107  bone]
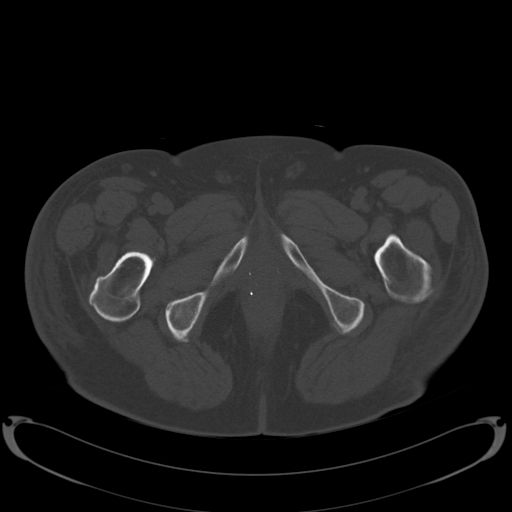
[im 15/107  soft-tissue]
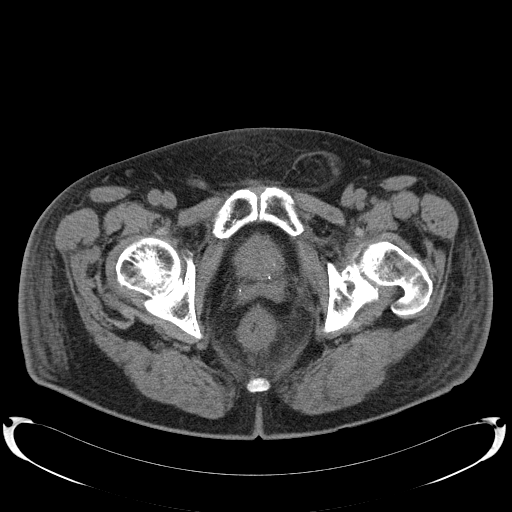
[im 22/107  soft-tissue]
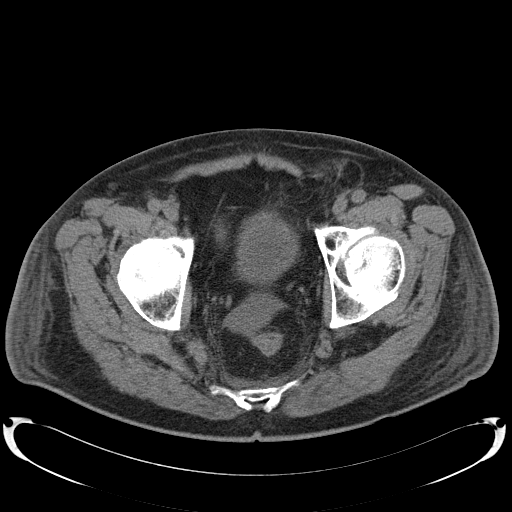
[im 29/107  soft-tissue]
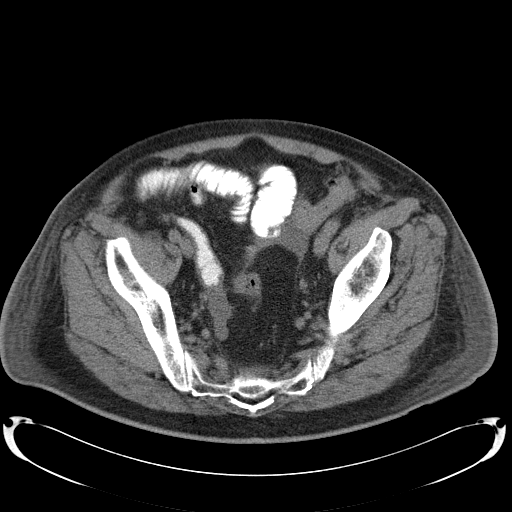
[im 36/107  soft-tissue]
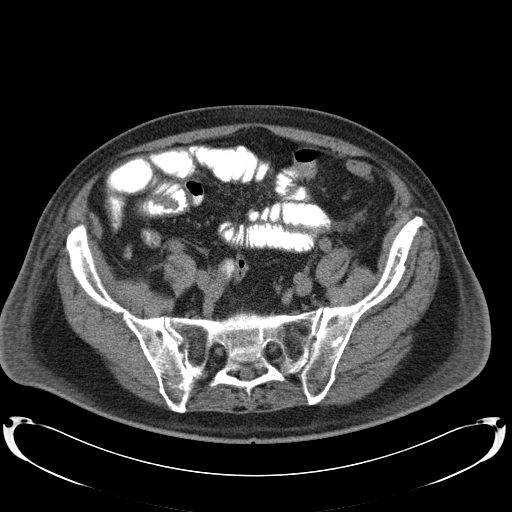
[im 43/107  soft-tissue]
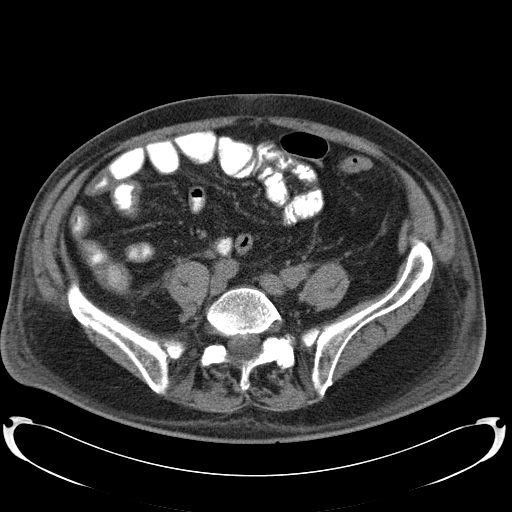
[im 57/107  soft-tissue]
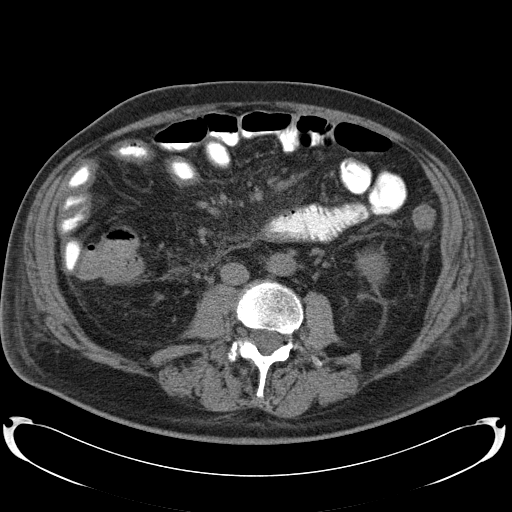
[im 64/107  soft-tissue]
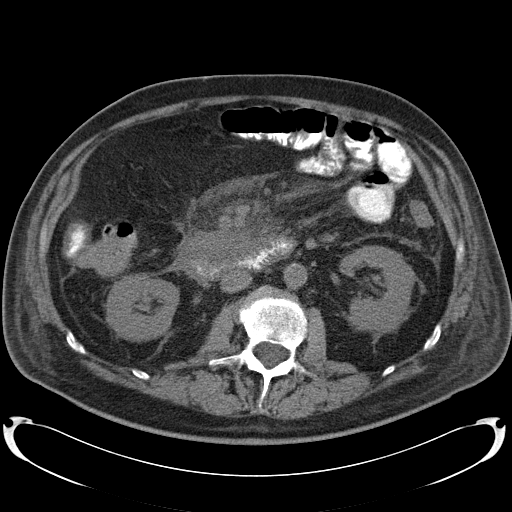
[im 71/107  soft-tissue]
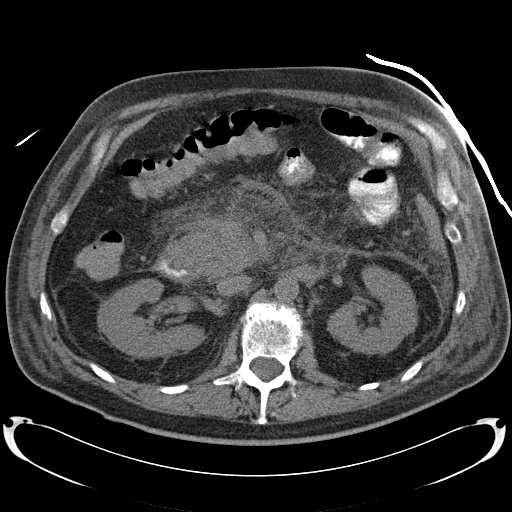
[im 71/107  bone]
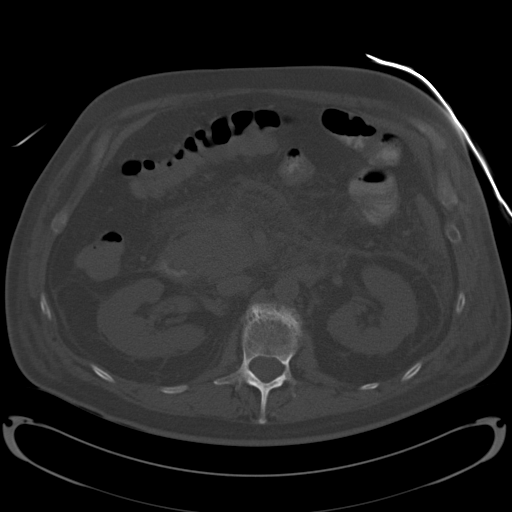
[im 78/107  soft-tissue]
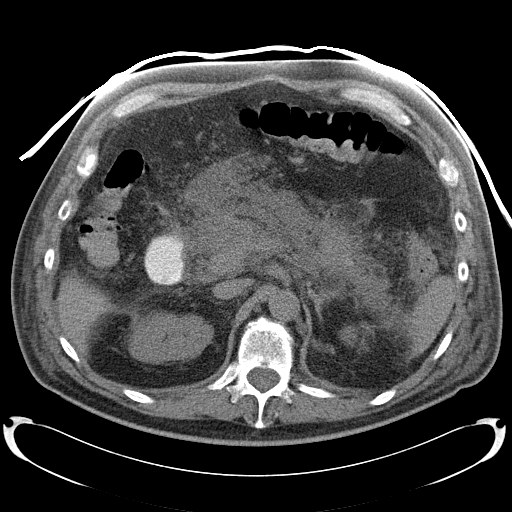
[im 78/107  lung]
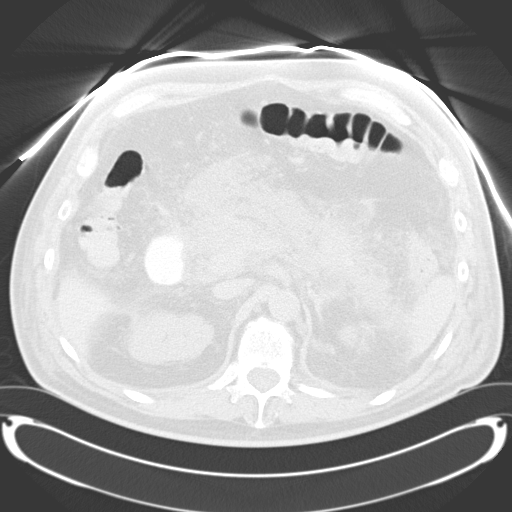
[im 85/107  soft-tissue]
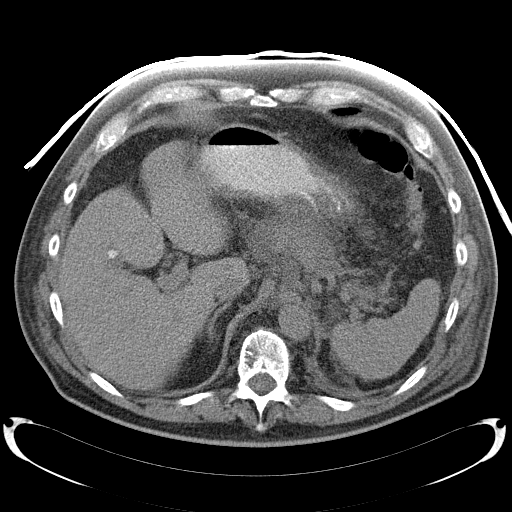
[im 85/107  lung]
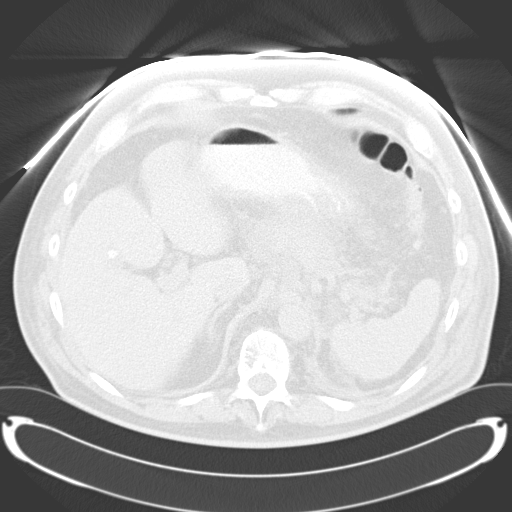
[im 92/107  soft-tissue]
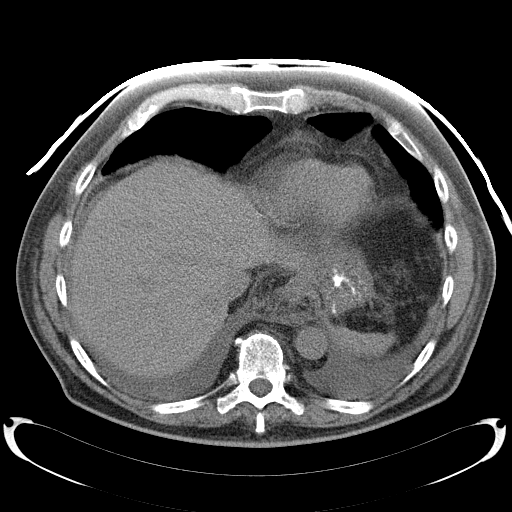
[im 92/107  lung]
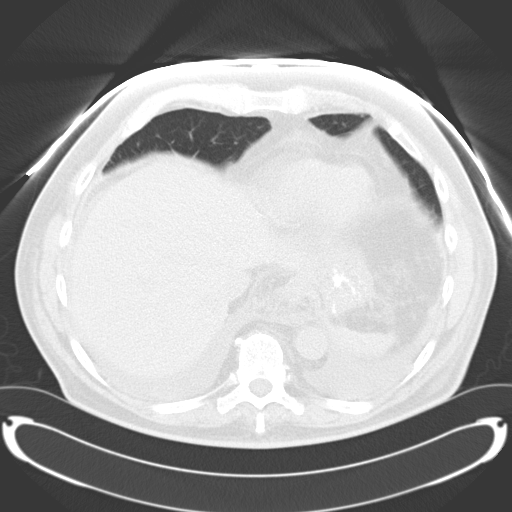
[im 99/107  soft-tissue]
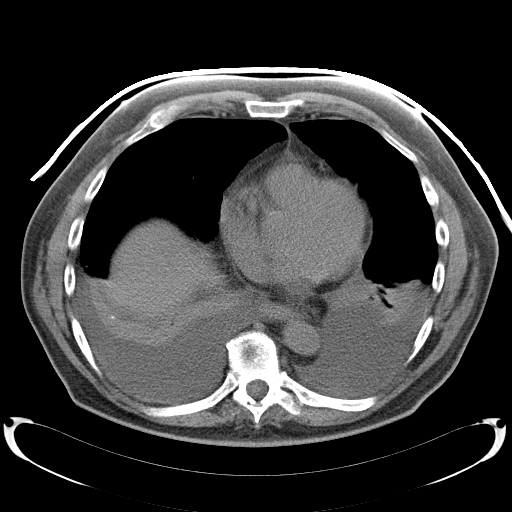
[im 99/107  lung]
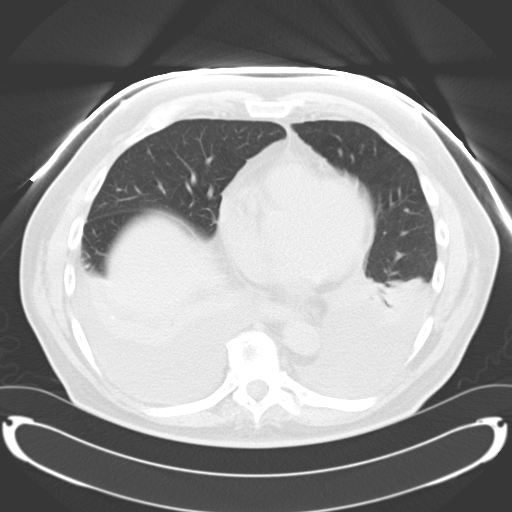

[13 of 32 positions shown; findings below may reference images not displayed]

FINDINGS: Moderate layering bilateral pleural effusions are new.
Near complete lower lobe atelectasis.  No pericardial effusion.
Degenerative changes throughout the spine.  No suspicious osseous
lesion.  Sequelae of radiation therapy to the prostate.  Fat
containing left inguinal hernia now contains a small volume of
fluid.  Small volume of presacral fluid and stranding.  Small
volume of intraperitoneal fluid in the pelvis.  Mild diverticulosis
of the distal colon.  Redundant sigmoid.  No dilated bowel.  Oral
contrast has reached the ascending colon.  Secondary inflammatory
changes of many small bowel loops with bowel wall thickening, more
so in the right abdomen.  Tremendous inflammatory changes tracking
from the lesser sac into the small bowel mesentery compatible with
severe acute pancreatitis.  No contrast administered.  Stomach and
gallbladder are secondarily involved but decompressed.  Multiple
cholelithiasis re-identified.  Liver within normal limits.  Spleen
within normal limits aside from the surrounding inflammation.
Adrenal glands and noncontrast kidneys are stable.  No definite
organized fluid collection evident in the absence of contrast.
Vascular patency cannot be evaluated.
IMPRESSION: 1.  Severe acute pancreatitis.  Free fluid.  No definite organized
fluid collection identified in the absence of contrast.
2.  Moderate bilateral pleural effusions with lower lobe collapse.
3.  Widespread secondary intestinal wall thickening.  No bowel
obstruction.

## 2011-02-03 NOTE — Op Note (Signed)
   NAME:  Vincent Cline, Vincent Cline NO.:  [id number]   MEDICAL RECORD NO.:  [id number]                   PATIENT TYPE:  AMB   LOCATION:  [facility]                                 FACILITY:  [facility]   PHYSICIAN:  Claudette Laws, M.D.               DATE OF BIRTH:  1934-04-18   DATE OF PROCEDURE:  02/12/2003  DATE OF DISCHARGE:                                 OPERATIVE REPORT   PREOPERATIVE DIAGNOSIS:  1. Clinical stage T1C Gleason grade 7 carcinoma of the prostate gland.  2. Status post 4,500 preoperative external radiation therapy.   POSTOPERATIVE DIAGNOSIS:  1. Clinical stage T1C Gleason grade 7 carcinoma of the prostate gland.  2. Status post 4,500 preoperative external radiation therapy.   OPERATION PERFORMED:  Transperineal radioactive seed implantation, pan  cystoscopy.   SURGEON:  Claudette Laws, M.D.   DESCRIPTION OF PROCEDURE:  The patient was prepped and draped in the dorsal  lithotomy position under intubated general anesthesia.  Dr. Maryln Gottron, M.D. placed the probe per rectum and preplanning study was then  carried out.  Using a combination of ultrasonic control and the C-arm, we  then implanted a total of 26 needles.  The top seeds were placed at the 0  base all the back to retraction of 1.5.  A total of 94 seeds were implanted  without incident.  The anchors were at b.3.0 and e.3.0.  We also placed two  gold dummy seeds at the base.   At the end of the procedure cystoscopy was performed revealing a single seed  on a strand which we removed with the flexible grabber.  We suspect this was  one of the first single seeds planted near the base.  Otherwise, the rest of  the bladder looked normal, smooth.  There was no tumors, no calculi, normal  ureteral orifices.   A #18 French 10 cc Foley was then hooked to straight drain and the patient  was taken back to the recovery room in satisfactory condition.         Claudette Laws, M.D.    RFS/MEDQ  D:  02/12/2003  T:  02/12/2003  Job:  [id number]

## 2011-10-16 DIAGNOSIS — K4090 Unilateral inguinal hernia, without obstruction or gangrene, not specified as recurrent: Secondary | ICD-10-CM | POA: Diagnosis not present

## 2011-10-16 DIAGNOSIS — Z23 Encounter for immunization: Secondary | ICD-10-CM | POA: Diagnosis not present

## 2011-10-16 DIAGNOSIS — E119 Type 2 diabetes mellitus without complications: Secondary | ICD-10-CM | POA: Diagnosis not present

## 2012-05-07 DIAGNOSIS — E7800 Pure hypercholesterolemia, unspecified: Secondary | ICD-10-CM | POA: Diagnosis not present

## 2012-05-07 DIAGNOSIS — Z0000 Encounter for general adult medical examination without abnormal findings: Secondary | ICD-10-CM | POA: Diagnosis not present

## 2012-06-10 ENCOUNTER — Emergency Department (HOSPITAL_COMMUNITY): Payer: Medicare Other

## 2012-06-10 ENCOUNTER — Emergency Department (HOSPITAL_COMMUNITY)
Admission: EM | Admit: 2012-06-10 | Discharge: 2012-06-10 | Disposition: A | Discharge status: Home / Self Care | Payer: Medicare Other | Attending: Emergency Medicine | Admitting: Emergency Medicine

## 2012-06-10 ENCOUNTER — Encounter (HOSPITAL_COMMUNITY): Payer: Self-pay

## 2012-06-10 DIAGNOSIS — IMO0002 Reserved for concepts with insufficient information to code with codable children: Secondary | ICD-10-CM | POA: Diagnosis not present

## 2012-06-10 DIAGNOSIS — S8990XA Unspecified injury of unspecified lower leg, initial encounter: Secondary | ICD-10-CM | POA: Diagnosis not present

## 2012-06-10 DIAGNOSIS — J8410 Pulmonary fibrosis, unspecified: Secondary | ICD-10-CM | POA: Diagnosis not present

## 2012-06-10 DIAGNOSIS — T1490XA Injury, unspecified, initial encounter: Secondary | ICD-10-CM | POA: Diagnosis not present

## 2012-06-10 DIAGNOSIS — S0083XA Contusion of other part of head, initial encounter: Secondary | ICD-10-CM | POA: Diagnosis not present

## 2012-06-10 DIAGNOSIS — S0003XA Contusion of scalp, initial encounter: Secondary | ICD-10-CM | POA: Diagnosis not present

## 2012-06-10 DIAGNOSIS — R221 Localized swelling, mass and lump, neck: Secondary | ICD-10-CM | POA: Diagnosis not present

## 2012-06-10 DIAGNOSIS — J984 Other disorders of lung: Secondary | ICD-10-CM | POA: Diagnosis not present

## 2012-06-10 DIAGNOSIS — M25529 Pain in unspecified elbow: Secondary | ICD-10-CM | POA: Diagnosis not present

## 2012-06-10 DIAGNOSIS — T148XXA Other injury of unspecified body region, initial encounter: Secondary | ICD-10-CM | POA: Diagnosis not present

## 2012-06-10 DIAGNOSIS — M25569 Pain in unspecified knee: Secondary | ICD-10-CM | POA: Diagnosis not present

## 2012-06-10 DIAGNOSIS — S298XXA Other specified injuries of thorax, initial encounter: Secondary | ICD-10-CM | POA: Diagnosis not present

## 2012-06-10 DIAGNOSIS — M5030 Other cervical disc degeneration, unspecified cervical region: Secondary | ICD-10-CM | POA: Diagnosis not present

## 2012-06-10 DIAGNOSIS — S1093XA Contusion of unspecified part of neck, initial encounter: Secondary | ICD-10-CM | POA: Diagnosis not present

## 2012-06-10 HISTORY — DX: Malignant neoplasm of prostate: C61

## 2012-06-10 HISTORY — DX: Personal history of irradiation: Z92.3

## 2012-06-10 MED ORDER — TETANUS-DIPHTHERIA TOXOIDS TD 5-2 LFU IM INJ
0.5000 mL | INJECTION | Freq: Once | INTRAMUSCULAR | Status: AC
  Administered 2012-06-10: 0.5 mL via INTRAMUSCULAR
  Filled 2012-06-10: qty 0.5

## 2012-06-10 MED ORDER — ACETAMINOPHEN 325 MG PO TABS: 650.0000 mg | ORAL_TABLET | Freq: Once | ORAL | Status: DC
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
## 2012-06-10 NOTE — ED Notes (Signed)
The patient is AOx4 and comfortable with his discharge instructions.  His family is here to drive him home. 
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
## 2012-06-13 DIAGNOSIS — IMO0002 Reserved for concepts with insufficient information to code with codable children: Secondary | ICD-10-CM | POA: Diagnosis not present

## 2012-06-13 DIAGNOSIS — T148XXA Other injury of unspecified body region, initial encounter: Secondary | ICD-10-CM | POA: Diagnosis not present

## 2012-06-13 DIAGNOSIS — Z23 Encounter for immunization: Secondary | ICD-10-CM | POA: Diagnosis not present

## 2012-06-18 DIAGNOSIS — H2510 Age-related nuclear cataract, unspecified eye: Secondary | ICD-10-CM | POA: Diagnosis not present

## 2012-06-18 DIAGNOSIS — H0230 Blepharochalasis unspecified eye, unspecified eyelid: Secondary | ICD-10-CM | POA: Diagnosis not present

## 2012-06-18 DIAGNOSIS — E119 Type 2 diabetes mellitus without complications: Secondary | ICD-10-CM | POA: Diagnosis not present

## 2012-07-19 DIAGNOSIS — C61 Malignant neoplasm of prostate: Secondary | ICD-10-CM | POA: Diagnosis not present

## 2012-11-14 DIAGNOSIS — E119 Type 2 diabetes mellitus without complications: Secondary | ICD-10-CM | POA: Diagnosis not present

## 2012-12-10 ENCOUNTER — Other Ambulatory Visit: Payer: Self-pay | Admitting: Gastroenterology

## 2012-12-10 DIAGNOSIS — D126 Benign neoplasm of colon, unspecified: Secondary | ICD-10-CM | POA: Diagnosis not present

## 2012-12-10 DIAGNOSIS — K5730 Diverticulosis of large intestine without perforation or abscess without bleeding: Secondary | ICD-10-CM | POA: Diagnosis not present

## 2012-12-10 DIAGNOSIS — Z800 Family history of malignant neoplasm of digestive organs: Secondary | ICD-10-CM | POA: Diagnosis not present

## 2012-12-10 DIAGNOSIS — Z1211 Encounter for screening for malignant neoplasm of colon: Secondary | ICD-10-CM | POA: Diagnosis not present

## 2013-05-14 DIAGNOSIS — E119 Type 2 diabetes mellitus without complications: Secondary | ICD-10-CM | POA: Diagnosis not present

## 2013-05-14 DIAGNOSIS — Z0000 Encounter for general adult medical examination without abnormal findings: Secondary | ICD-10-CM | POA: Diagnosis not present

## 2013-06-17 DIAGNOSIS — Z23 Encounter for immunization: Secondary | ICD-10-CM | POA: Diagnosis not present

## 2013-06-24 DIAGNOSIS — E119 Type 2 diabetes mellitus without complications: Secondary | ICD-10-CM | POA: Diagnosis not present

## 2013-06-24 DIAGNOSIS — H2510 Age-related nuclear cataract, unspecified eye: Secondary | ICD-10-CM | POA: Diagnosis not present

## 2013-06-24 DIAGNOSIS — H02839 Dermatochalasis of unspecified eye, unspecified eyelid: Secondary | ICD-10-CM | POA: Diagnosis not present

## 2013-06-24 DIAGNOSIS — H43819 Vitreous degeneration, unspecified eye: Secondary | ICD-10-CM | POA: Diagnosis not present

## 2013-07-23 DIAGNOSIS — C61 Malignant neoplasm of prostate: Secondary | ICD-10-CM | POA: Diagnosis not present

## 2013-11-14 DIAGNOSIS — Z23 Encounter for immunization: Secondary | ICD-10-CM | POA: Diagnosis not present

## 2013-11-14 DIAGNOSIS — E7800 Pure hypercholesterolemia, unspecified: Secondary | ICD-10-CM | POA: Diagnosis not present

## 2013-11-14 DIAGNOSIS — E119 Type 2 diabetes mellitus without complications: Secondary | ICD-10-CM | POA: Diagnosis not present

## 2014-05-14 DIAGNOSIS — Z23 Encounter for immunization: Secondary | ICD-10-CM | POA: Diagnosis not present

## 2014-05-14 DIAGNOSIS — E7800 Pure hypercholesterolemia, unspecified: Secondary | ICD-10-CM | POA: Diagnosis not present

## 2014-05-14 DIAGNOSIS — E119 Type 2 diabetes mellitus without complications: Secondary | ICD-10-CM | POA: Diagnosis not present

## 2014-07-24 DIAGNOSIS — C61 Malignant neoplasm of prostate: Secondary | ICD-10-CM | POA: Diagnosis not present

## 2014-08-06 DIAGNOSIS — E119 Type 2 diabetes mellitus without complications: Secondary | ICD-10-CM | POA: Diagnosis not present

## 2014-08-06 DIAGNOSIS — H02834 Dermatochalasis of left upper eyelid: Secondary | ICD-10-CM | POA: Diagnosis not present

## 2014-08-06 DIAGNOSIS — H2513 Age-related nuclear cataract, bilateral: Secondary | ICD-10-CM | POA: Diagnosis not present

## 2014-08-06 DIAGNOSIS — H02831 Dermatochalasis of right upper eyelid: Secondary | ICD-10-CM | POA: Diagnosis not present

## 2014-11-16 DIAGNOSIS — E119 Type 2 diabetes mellitus without complications: Secondary | ICD-10-CM | POA: Diagnosis not present

## 2014-11-16 DIAGNOSIS — D489 Neoplasm of uncertain behavior, unspecified: Secondary | ICD-10-CM | POA: Diagnosis not present

## 2014-11-16 DIAGNOSIS — E780 Pure hypercholesterolemia: Secondary | ICD-10-CM | POA: Diagnosis not present

## 2014-12-07 DIAGNOSIS — L821 Other seborrheic keratosis: Secondary | ICD-10-CM | POA: Diagnosis not present

## 2014-12-07 DIAGNOSIS — D1801 Hemangioma of skin and subcutaneous tissue: Secondary | ICD-10-CM | POA: Diagnosis not present

## 2014-12-07 DIAGNOSIS — L718 Other rosacea: Secondary | ICD-10-CM | POA: Diagnosis not present

## 2015-05-18 DIAGNOSIS — Z23 Encounter for immunization: Secondary | ICD-10-CM | POA: Diagnosis not present

## 2015-05-18 DIAGNOSIS — E119 Type 2 diabetes mellitus without complications: Secondary | ICD-10-CM | POA: Diagnosis not present

## 2015-05-18 DIAGNOSIS — M545 Low back pain: Secondary | ICD-10-CM | POA: Diagnosis not present

## 2015-05-18 DIAGNOSIS — E780 Pure hypercholesterolemia: Secondary | ICD-10-CM | POA: Diagnosis not present

## 2015-07-28 DIAGNOSIS — C61 Malignant neoplasm of prostate: Secondary | ICD-10-CM | POA: Diagnosis not present

## 2015-08-10 DIAGNOSIS — H10413 Chronic giant papillary conjunctivitis, bilateral: Secondary | ICD-10-CM | POA: Diagnosis not present

## 2015-08-10 DIAGNOSIS — H02831 Dermatochalasis of right upper eyelid: Secondary | ICD-10-CM | POA: Diagnosis not present

## 2015-08-10 DIAGNOSIS — E119 Type 2 diabetes mellitus without complications: Secondary | ICD-10-CM | POA: Diagnosis not present

## 2015-08-10 DIAGNOSIS — H02834 Dermatochalasis of left upper eyelid: Secondary | ICD-10-CM | POA: Diagnosis not present

## 2015-11-16 DIAGNOSIS — Z7984 Long term (current) use of oral hypoglycemic drugs: Secondary | ICD-10-CM | POA: Diagnosis not present

## 2015-11-16 DIAGNOSIS — E7800 Pure hypercholesterolemia, unspecified: Secondary | ICD-10-CM | POA: Diagnosis not present

## 2015-11-16 DIAGNOSIS — E119 Type 2 diabetes mellitus without complications: Secondary | ICD-10-CM | POA: Diagnosis not present

## 2015-12-10 ENCOUNTER — Emergency Department (HOSPITAL_COMMUNITY)
Admission: EM | Admit: 2015-12-10 | Discharge: 2015-12-10 | Disposition: A | Discharge status: Home / Self Care | Payer: Medicare Other | Attending: Emergency Medicine | Admitting: Emergency Medicine

## 2015-12-10 ENCOUNTER — Encounter (HOSPITAL_COMMUNITY): Payer: Self-pay | Admitting: Emergency Medicine

## 2015-12-10 DIAGNOSIS — Z7982 Long term (current) use of aspirin: Secondary | ICD-10-CM | POA: Insufficient documentation

## 2015-12-10 DIAGNOSIS — Z923 Personal history of irradiation: Secondary | ICD-10-CM | POA: Insufficient documentation

## 2015-12-10 DIAGNOSIS — Z8546 Personal history of malignant neoplasm of prostate: Secondary | ICD-10-CM | POA: Insufficient documentation

## 2015-12-10 DIAGNOSIS — Z7984 Long term (current) use of oral hypoglycemic drugs: Secondary | ICD-10-CM | POA: Insufficient documentation

## 2015-12-10 DIAGNOSIS — E1165 Type 2 diabetes mellitus with hyperglycemia: Secondary | ICD-10-CM | POA: Insufficient documentation

## 2015-12-10 DIAGNOSIS — R404 Transient alteration of awareness: Secondary | ICD-10-CM | POA: Diagnosis not present

## 2015-12-10 DIAGNOSIS — R531 Weakness: Secondary | ICD-10-CM | POA: Diagnosis not present

## 2015-12-10 LAB — BASIC METABOLIC PANEL
ANION GAP: 10 (ref 5–15)
BUN: 26 mg/dL — ABNORMAL HIGH (ref 6–20)
CALCIUM: 9 mg/dL (ref 8.9–10.3)
CHLORIDE: 108 mmol/L (ref 101–111)
CO2: 23 mmol/L (ref 22–32)
Creatinine, Ser: 1.14 mg/dL (ref 0.61–1.24)
GFR calc non Af Amer: 59 mL/min — ABNORMAL LOW (ref >60)
GLUCOSE: 317 mg/dL — AB (ref 65–99)
POTASSIUM: 4.5 mmol/L (ref 3.5–5.1)
Sodium: 141 mmol/L (ref 135–145)

## 2015-12-10 LAB — CBC WITH DIFFERENTIAL/PLATELET
BASOS ABS: 0 10*3/uL (ref 0.0–0.1)
BASOS PCT: 0 %
Eosinophils Absolute: 0.1 10*3/uL (ref 0.0–0.7)
Eosinophils Relative: 1 %
HEMATOCRIT: 36.9 % — AB (ref 39.0–52.0)
HEMOGLOBIN: 12.9 g/dL — AB (ref 13.0–17.0)
LYMPHS PCT: 11 %
Lymphs Abs: 1 10*3/uL (ref 0.7–4.0)
MCH: 31.6 pg (ref 26.0–34.0)
MCHC: 35 g/dL (ref 30.0–36.0)
MCV: 90.4 fL (ref 78.0–100.0)
Monocytes Absolute: 0.7 10*3/uL (ref 0.1–1.0)
Monocytes Relative: 7 %
NEUTROS ABS: 7.8 10*3/uL — AB (ref 1.7–7.7)
NEUTROS PCT: 81 %
Platelets: 210 10*3/uL (ref 150–400)
RBC: 4.08 MIL/uL — AB (ref 4.22–5.81)
RDW: 12.8 % (ref 11.5–15.5)
WBC: 9.6 10*3/uL (ref 4.0–10.5)

## 2015-12-10 LAB — TROPONIN I

## 2015-12-10 LAB — CBG MONITORING, ED
GLUCOSE-CAPILLARY: 283 mg/dL — AB (ref 65–99)
GLUCOSE-CAPILLARY: 382 mg/dL — AB (ref 65–99)

## 2015-12-10 MED ORDER — SODIUM CHLORIDE 0.9 % IV BOLUS (SEPSIS)
1000.0000 mL | Freq: Once | INTRAVENOUS | Status: AC
  Administered 2015-12-10: 1000 mL via INTRAVENOUS

## 2015-12-10 MED ORDER — SODIUM CHLORIDE 0.9 % IV SOLN
INTRAVENOUS | Status: DC
  Administered 2015-12-10: 22:00:00 via INTRAVENOUS

## 2015-12-10 NOTE — Discharge Instructions (Signed)

## 2015-12-10 NOTE — ED Provider Notes (Signed)
CSN: [id number]     Arrival date & time 12/10/15  1942 History   First MD Initiated Contact with Patient 12/10/15 2006     Chief Complaint  Patient presents with  . Hyperglycemia     (Consider location/radiation/quality/duration/timing/severity/associated sxs/prior Treatment) HPI Comments: Patient here after having a near syncopal event at church today. States that he was doing lots of sitting standing and kneeling. He normally has a low blood pressure today had diffuse whole-body weakness without associated dyspnea or chest pain or diaphoresis. There was concern that his blood sugar might have been low and he was given grape juice to drink. EMS was called and patient's blood sugar was noted to be 43 and then went to 560. Patient has not used his metformin this evening and EMS did give the patient 400 mL of saline. He denies any recent illnesses. As the abdominal discomfort. Feels back to his baseline  Patient is a 82 y.o. male presenting with hyperglycemia. The history is provided by the patient and a relative.  Hyperglycemia   Past Medical History  Diagnosis Date  . Diabetes mellitus   . Prostate cancer ([place])   . Hx of radiation therapy     with seed implant for prostate ca 9 1/2 yrs ago   Past Surgical History  Procedure Laterality Date  . Stomach surgery      to correct pyloric stenosis as a newborn  . Wisdom tooth extraction    . Tooth extraction    . Tonsillectomy     Family History  Problem Relation Age of Onset  . Gallbladder disease Mother   . Coronary artery disease Mother   . Colon cancer Father   . Hypertension Father   . Glaucoma Father    Social History  Substance Use Topics  . Smoking status: Never Smoker   . Smokeless tobacco: Never Used  . Alcohol Use: No    Review of Systems  All other systems reviewed and are negative.     Allergies  Review of patient's allergies indicates no known allergies.  Home Medications   Prior to Admission  medications   Medication Sig Start Date End Date Taking? Authorizing Provider  aspirin EC 81 MG tablet Take 81 mg by mouth daily.    Historical Provider, MD  metFORMIN (GLUCOPHAGE) 500 MG tablet Take 500 mg by mouth 2 (two) times daily with a meal.    Historical Provider, MD   BP 111/64 mmHg  Pulse 80  Temp(Src) 97.5 F (36.4 C) (Oral)  Resp 23  Ht 5\' 11"  (1.803 m)  Wt 79.379 kg  BMI 24.42 kg/m2  SpO2 96% Physical Exam  Constitutional: He is oriented to person, place, and time. He appears well-developed and well-nourished.  Non-toxic appearance. No distress.  HENT:  Head: Normocephalic and atraumatic.  Eyes: Conjunctivae, EOM and lids are normal. Pupils are equal, round, and reactive to light.  Neck: Normal range of motion. Neck supple. No tracheal deviation present. No thyroid mass present.  Cardiovascular: Normal rate, regular rhythm and normal heart sounds.  Exam reveals no gallop.   No murmur heard. Pulmonary/Chest: Effort normal and breath sounds normal. No stridor. No respiratory distress. He has no decreased breath sounds. He has no wheezes. He has no rhonchi. He has no rales.  Abdominal: Soft. Normal appearance and bowel sounds are normal. He exhibits no distension. There is no tenderness. There is no rebound and no CVA tenderness.  Musculoskeletal: Normal range of motion. He exhibits no edema or  tenderness.  Neurological: He is alert and oriented to person, place, and time. He has normal strength. No cranial nerve deficit or sensory deficit. GCS eye subscore is 4. GCS verbal subscore is 5. GCS motor subscore is 6.  Skin: Skin is warm and dry. No abrasion and no rash noted.  Psychiatric: He has a normal mood and affect. His speech is normal and behavior is normal.  Nursing note and vitals reviewed.   ED Course  Procedures (including critical care time) Labs Review Labs Reviewed  CBG MONITORING, ED - Abnormal; Notable for the following:    Glucose-Capillary 283 (*)    All  other components within normal limits  CBC WITH DIFFERENTIAL/PLATELET  BASIC METABOLIC PANEL  TROPONIN I    Imaging Review No results found. I have personally reviewed and evaluated these images and lab results as part of my medical decision-making.   EKG Interpretation   Date/Time:  Friday December 10 2015 20:29:49 EDT Ventricular Rate:  80 PR Interval:  177 QRS Duration: 85 QT Interval:  377 QTC Calculation: 435 R Axis:   56 Text Interpretation:  Sinus rhythm Low voltage, precordial leads Abnormal  R-wave progression, early transition No significant change since last  tracing Confirmed by Grayland Daisey  MD, Shanna Un ([id number]) on 12/10/2015 9:51:23 PM      MDM   Final diagnoses:  None    Patient is not orthostatic and blood sugar has been improving. I stood and walked him in the room and he ambulated without any difficulty. Stable for discharge    Lacretia Leigh, MD 12/10/15 2221

## 2015-12-10 NOTE — ED Notes (Signed)
Pt reports understanding of discharge information. No questions at time of discharge 

## 2015-12-10 NOTE — ED Notes (Signed)
Bed: [id number] Expected date:  Expected time:  Means of arrival:  Comments: EMS 82 yo M Syncope / Hyperglycemia

## 2015-12-10 NOTE — Progress Notes (Signed)
Patient listed as having Medicare insurance without a pcp.  EDCM spoke to patient at bedside.  Patient confirms his pcp is Dr. Donnie Coffin with [facility].  System updated.

## 2015-12-10 NOTE — ED Notes (Signed)
Per EMS pt was at church and had a near syncopal episode. Pt reports to having eaten tonight and not taken Metformin this evening.  20GA IV to Right Hand with 455ml NS given. EMS reports that glucose initially was 483 then recheck was 560. Pt alert and oriented x 4.

## 2015-12-17 DIAGNOSIS — E119 Type 2 diabetes mellitus without complications: Secondary | ICD-10-CM | POA: Diagnosis not present

## 2015-12-17 DIAGNOSIS — Z7984 Long term (current) use of oral hypoglycemic drugs: Secondary | ICD-10-CM | POA: Diagnosis not present

## 2015-12-17 DIAGNOSIS — R55 Syncope and collapse: Secondary | ICD-10-CM | POA: Diagnosis not present

## 2016-04-24 DIAGNOSIS — E119 Type 2 diabetes mellitus without complications: Secondary | ICD-10-CM | POA: Diagnosis not present

## 2016-04-24 DIAGNOSIS — Z7984 Long term (current) use of oral hypoglycemic drugs: Secondary | ICD-10-CM | POA: Diagnosis not present

## 2016-04-24 DIAGNOSIS — E7800 Pure hypercholesterolemia, unspecified: Secondary | ICD-10-CM | POA: Diagnosis not present

## 2016-06-09 DIAGNOSIS — Z23 Encounter for immunization: Secondary | ICD-10-CM | POA: Diagnosis not present

## 2016-08-04 DIAGNOSIS — Z8546 Personal history of malignant neoplasm of prostate: Secondary | ICD-10-CM | POA: Diagnosis not present

## 2016-08-22 DIAGNOSIS — H02834 Dermatochalasis of left upper eyelid: Secondary | ICD-10-CM | POA: Diagnosis not present

## 2016-08-22 DIAGNOSIS — H2513 Age-related nuclear cataract, bilateral: Secondary | ICD-10-CM | POA: Diagnosis not present

## 2016-08-22 DIAGNOSIS — E119 Type 2 diabetes mellitus without complications: Secondary | ICD-10-CM | POA: Diagnosis not present

## 2016-08-22 DIAGNOSIS — H02831 Dermatochalasis of right upper eyelid: Secondary | ICD-10-CM | POA: Diagnosis not present

## 2016-08-22 DIAGNOSIS — H10413 Chronic giant papillary conjunctivitis, bilateral: Secondary | ICD-10-CM | POA: Diagnosis not present

## 2017-01-28 ENCOUNTER — Emergency Department (HOSPITAL_COMMUNITY)
Admission: EM | Admit: 2017-01-28 | Discharge: 2017-01-28 | Disposition: A | Discharge status: Home / Self Care | Payer: Medicare Other | Attending: Emergency Medicine | Admitting: Emergency Medicine

## 2017-01-28 ENCOUNTER — Encounter (HOSPITAL_COMMUNITY): Payer: Self-pay | Admitting: Emergency Medicine

## 2017-01-28 DIAGNOSIS — Z8546 Personal history of malignant neoplasm of prostate: Secondary | ICD-10-CM | POA: Insufficient documentation

## 2017-01-28 DIAGNOSIS — Z7982 Long term (current) use of aspirin: Secondary | ICD-10-CM | POA: Diagnosis not present

## 2017-01-28 DIAGNOSIS — R55 Syncope and collapse: Secondary | ICD-10-CM | POA: Insufficient documentation

## 2017-01-28 DIAGNOSIS — Z79899 Other long term (current) drug therapy: Secondary | ICD-10-CM | POA: Diagnosis not present

## 2017-01-28 DIAGNOSIS — I951 Orthostatic hypotension: Secondary | ICD-10-CM | POA: Diagnosis not present

## 2017-01-28 DIAGNOSIS — E119 Type 2 diabetes mellitus without complications: Secondary | ICD-10-CM | POA: Insufficient documentation

## 2017-01-28 DIAGNOSIS — R404 Transient alteration of awareness: Secondary | ICD-10-CM | POA: Diagnosis not present

## 2017-01-28 DIAGNOSIS — Z7984 Long term (current) use of oral hypoglycemic drugs: Secondary | ICD-10-CM | POA: Insufficient documentation

## 2017-01-28 LAB — BASIC METABOLIC PANEL
Anion gap: 8 (ref 5–15)
BUN: 22 mg/dL — AB (ref 6–20)
CALCIUM: 8.6 mg/dL — AB (ref 8.9–10.3)
CHLORIDE: 110 mmol/L (ref 101–111)
CO2: 21 mmol/L — AB (ref 22–32)
CREATININE: 1.16 mg/dL (ref 0.61–1.24)
GFR calc non Af Amer: 57 mL/min — ABNORMAL LOW (ref >60)
GLUCOSE: 145 mg/dL — AB (ref 65–99)
Potassium: 4.1 mmol/L (ref 3.5–5.1)
Sodium: 139 mmol/L (ref 135–145)

## 2017-01-28 LAB — CBC WITH DIFFERENTIAL/PLATELET
BASOS ABS: 0 10*3/uL (ref 0.0–0.1)
Basophils Relative: 0 %
Eosinophils Absolute: 0.1 10*3/uL (ref 0.0–0.7)
Eosinophils Relative: 3 %
HEMATOCRIT: 35.3 % — AB (ref 39.0–52.0)
Hemoglobin: 11.5 g/dL — ABNORMAL LOW (ref 13.0–17.0)
LYMPHS PCT: 38 %
Lymphs Abs: 2.2 10*3/uL (ref 0.7–4.0)
MCH: 28.6 pg (ref 26.0–34.0)
MCHC: 32.6 g/dL (ref 30.0–36.0)
MCV: 87.8 fL (ref 78.0–100.0)
Monocytes Absolute: 0.3 10*3/uL (ref 0.1–1.0)
Monocytes Relative: 5 %
NEUTROS ABS: 3 10*3/uL (ref 1.7–7.7)
NEUTROS PCT: 54 %
PLATELETS: 201 10*3/uL (ref 150–400)
RBC: 4.02 MIL/uL — AB (ref 4.22–5.81)
RDW: 13.9 % (ref 11.5–15.5)
WBC: 5.6 10*3/uL (ref 4.0–10.5)

## 2017-01-28 LAB — URINALYSIS, ROUTINE W REFLEX MICROSCOPIC
BILIRUBIN URINE: NEGATIVE
GLUCOSE, UA: NEGATIVE mg/dL
HGB URINE DIPSTICK: NEGATIVE
KETONES UR: NEGATIVE mg/dL
Leukocytes, UA: NEGATIVE
Nitrite: NEGATIVE
PH: 5 (ref 5.0–8.0)
Protein, ur: NEGATIVE mg/dL
SPECIFIC GRAVITY, URINE: 1.023 (ref 1.005–1.030)

## 2017-01-28 LAB — CBG MONITORING, ED: Glucose-Capillary: 111 mg/dL — ABNORMAL HIGH (ref 65–99)

## 2017-01-28 MED ORDER — SODIUM CHLORIDE 0.9 % IV BOLUS (SEPSIS): 500.0000 mL | Freq: Once | INTRAVENOUS | Status: DC

## 2017-01-28 MED ORDER — SODIUM CHLORIDE 0.9 % IV BOLUS (SEPSIS)
500.0000 mL | Freq: Once | INTRAVENOUS | Status: AC
  Administered 2017-01-28: 500 mL via INTRAVENOUS

## 2017-01-28 NOTE — Discharge Instructions (Signed)
Please drink plenty of fluids and eat well. Your work up today is reassuring. Return without fail for worsening symptoms, including confusion, passing out, chest pain, difficulty breathing or any other symptoms concerning to  you.

## 2017-01-28 NOTE — ED Notes (Signed)
PT did not have to use RR at this time  

## 2017-01-28 NOTE — ED Provider Notes (Signed)
[facility] DEPT Provider Note   CSN: [id number] Arrival date & time: 01/28/17  4098     History   Chief Complaint Chief Complaint  Patient presents with  . Loss of Consciousness    HPI Vincent Cline is a 83 y.o. male.  The history is provided by the patient.  Loss of Consciousness   This is a new problem. The current episode started 1 to 2 hours ago. The problem occurs rarely. The problem has been resolved. He lost consciousness for a period of less than one minute. The problem is associated with standing up. Associated symptoms include light-headedness. Pertinent negatives include abdominal pain, back pain, bladder incontinence, bowel incontinence, chest pain, confusion, congestion, fever, focal sensory loss, headaches, malaise/fatigue, seizures, visual change, vomiting and weakness. He has tried nothing for the symptoms. His past medical history is significant for DM.   83 year old male who presents with syncope. He has a history of diabetes. This morning had a glass of water but no breakfast. [name] to church early. States that he was standing up for a long time hearing a sermon when he began to feel unwell. He sat down and placed his head between his legs because he felt very lightheaded, as if he could pass out. A woman nearby saw him and came to assist him he was noted to have loss of consciousness. When fire department arrived patient with snoring respirations. Bagging was attempted, but patient subsequently awoke. Was noted to have blood pressure of 86/40 initially and given 500 mL of normal saline with subsequent blood pressure of 107/82. Blood glucose is 132. On arrival to the ED, he feels like he is back to normal. He did not have tongue biting or urinary/bowel incontinence. No chest pain or difficulty breathing, palpitations, confusion, focal numbness or weakness. Past Medical History:  Diagnosis Date  . Diabetes mellitus   . Hx of radiation therapy    with seed implant  for prostate ca 9 1/2 yrs ago  . Prostate cancer ([place])     There are no active problems to display for this patient.   Past Surgical History:  Procedure Laterality Date  . STOMACH SURGERY     to correct pyloric stenosis as a newborn  . TONSILLECTOMY    . TOOTH EXTRACTION    . WISDOM TOOTH EXTRACTION         Home Medications    Prior to Admission medications   Medication Sig Start Date End Date Taking? Authorizing Provider  aspirin EC 81 MG tablet Take 162 mg by mouth daily.    Yes [provider]  clindamycin (CLEOCIN T) 1 % lotion Apply 1 application topically 2 (two) times daily as needed (for dermatitis).  09/29/15  Yes [provider]  metFORMIN (GLUCOPHAGE) 1000 MG tablet Take 1,000 mg by mouth 2 (two) times daily. 11/25/15  Yes [provider]  pravastatin (PRAVACHOL) 40 MG tablet Take 40 mg by mouth every evening. 12/08/15  Yes [provider]    Family History Family History  Problem Relation Age of Onset  . Gallbladder disease Mother   . Coronary artery disease Mother   . Colon cancer Father   . Hypertension Father   . Glaucoma Father     Social History Social History  Substance Use Topics  . Smoking status: Never Smoker  . Smokeless tobacco: Never Used  . Alcohol use No     Allergies   Patient has no known allergies.   Review of Systems Review  of Systems  Constitutional: Negative for fever and malaise/fatigue.  HENT: Negative for congestion.   Cardiovascular: Positive for syncope. Negative for chest pain.  Gastrointestinal: Negative for abdominal pain, bowel incontinence and vomiting.  Genitourinary: Negative for bladder incontinence.  Musculoskeletal: Negative for back pain.  Neurological: Positive for light-headedness. Negative for seizures, weakness and headaches.  Psychiatric/Behavioral: Negative for confusion.  All other systems reviewed and are negative.    Physical Exam Updated Vital Signs BP 122/70    Pulse 73   Temp 97.5 F (36.4 C) (Oral)   Resp 19   SpO2 97%   Physical Exam Physical Exam  Nursing note and vitals reviewed. Constitutional: Well developed, well nourished, non-toxic, and in no acute distress Head: Normocephalic and atraumatic.  Mouth/Throat: Oropharynx is clear and moist.  Neck: Normal range of motion. Neck supple.  Cardiovascular: Normal rate and regular rhythm.   Pulmonary/Chest: Effort normal and breath sounds normal.  Abdominal: Soft. There is no tenderness. There is no rebound and no guarding.  Musculoskeletal: Normal range of motion.  Neurological: Alert, no facial droop, fluent speech, moves all extremities symmetrically Skin: Skin is warm and dry.  Psychiatric: Cooperative   ED Treatments / Results  Labs (all labs ordered are listed, but only abnormal results are displayed) Labs Reviewed  BASIC METABOLIC PANEL - Abnormal; Notable for the following:       Result Value   CO2 21 (*)    Glucose, Bld 145 (*)    BUN 22 (*)    Calcium 8.6 (*)    GFR calc non Af Amer 57 (*)    All other components within normal limits  CBC WITH DIFFERENTIAL/PLATELET - Abnormal; Notable for the following:    RBC 4.02 (*)    Hemoglobin 11.5 (*)    HCT 35.3 (*)    All other components within normal limits  CBG MONITORING, ED - Abnormal; Notable for the following:    Glucose-Capillary 111 (*)    All other components within normal limits  URINALYSIS, ROUTINE W REFLEX MICROSCOPIC    EKG  EKG Interpretation  Date/Time:  Sunday Jan 28 2017 09:18:44 EDT Ventricular Rate:  67 PR Interval:    QRS Duration: 84 QT Interval:  421 QTC Calculation: 445 R Axis:   40 Text Interpretation:  Sinus rhythm Low voltage, precordial leads Abnormal R-wave progression, early transition no acute changes  Confirmed by Shykeem Resurreccion MD, Alainah Phang ([id number]) on 01/28/2017 10:01:04 AM       Radiology No results found.  Procedures Procedures (including critical care time)  Medications Ordered in  ED Medications  sodium chloride 0.9 % bolus 500 mL (0 mLs Intravenous Stopped 01/28/17 1220)     Initial Impression / Assessment and Plan / ED Course  I have reviewed the triage vital signs and the nursing notes.  Pertinent labs & imaging results that were available during my care of the patient were reviewed by me and considered in my medical decision making (see chart for details).     83  year old male who presents with syncopal episode. On arrival as well. No acute distress with stable vitals. History suggestive of orthostasis is initially has been on his feet for is a long period of time before feeling lightheaded. Also initially hypotensive with EMS, resolved with fluids. His EKG is without stigmata of arrhythmia or heart strain. No EKG findings suggestive of obstructive cardiomyopathy and no history for CHF or history suggestive of CHF or major valvular process such as aortic stenosis. Marland Kitchen He has stable  hemoglobin, no acute anemia or history of GI bleed. No major electrolyte or metabolic derangements. Patient with preference for discharge home. Patient to follow-up closely with PCP and return for worsening or recurrent symptoms. Strict return and follow-up instructions reviewed. He expressed understanding of all discharge instructions and felt comfortable with the plan of care.   Final Clinical Impressions(s) / ED Diagnoses   Final diagnoses:  Syncope and collapse  Orthostasis    New Prescriptions Discharge Medication List as of 01/28/2017  1:38 PM       Forde Dandy, MD 01/28/17 1525

## 2017-01-28 NOTE — ED Triage Notes (Signed)
Per EMS- pt was in church when he felt like he was going to pass out. Pt sat down and then passed out. Fire arrived to pt with snoring respirations. Fire brought pt out into lobby and upon starting to bag the pt he woke up. CBG 132, HR 70s normal sinus. BP initially;y 86/40. Pt given 500 CC saline. BP 107/82. Pt denies pain anywhere. States he had an episode like this before but that time was due to hyperglycemia. Pt feels back to normal.

## 2017-01-28 NOTE — ED Notes (Signed)
Pt did not need anything at this time  

## 2017-01-29 DIAGNOSIS — R55 Syncope and collapse: Secondary | ICD-10-CM | POA: Diagnosis not present

## 2017-05-11 DIAGNOSIS — E1165 Type 2 diabetes mellitus with hyperglycemia: Secondary | ICD-10-CM | POA: Diagnosis not present

## 2017-05-11 DIAGNOSIS — E7800 Pure hypercholesterolemia, unspecified: Secondary | ICD-10-CM | POA: Diagnosis not present

## 2017-05-11 DIAGNOSIS — Z7984 Long term (current) use of oral hypoglycemic drugs: Secondary | ICD-10-CM | POA: Diagnosis not present

## 2017-06-11 DIAGNOSIS — Z23 Encounter for immunization: Secondary | ICD-10-CM | POA: Diagnosis not present

## 2017-08-17 DIAGNOSIS — Z8546 Personal history of malignant neoplasm of prostate: Secondary | ICD-10-CM | POA: Diagnosis not present

## 2017-08-30 DIAGNOSIS — H02831 Dermatochalasis of right upper eyelid: Secondary | ICD-10-CM | POA: Diagnosis not present

## 2017-08-30 DIAGNOSIS — H2513 Age-related nuclear cataract, bilateral: Secondary | ICD-10-CM | POA: Diagnosis not present

## 2017-08-30 DIAGNOSIS — E119 Type 2 diabetes mellitus without complications: Secondary | ICD-10-CM | POA: Diagnosis not present

## 2017-08-30 DIAGNOSIS — H02834 Dermatochalasis of left upper eyelid: Secondary | ICD-10-CM | POA: Diagnosis not present

## 2017-11-23 DIAGNOSIS — E7800 Pure hypercholesterolemia, unspecified: Secondary | ICD-10-CM | POA: Diagnosis not present

## 2017-11-23 DIAGNOSIS — E119 Type 2 diabetes mellitus without complications: Secondary | ICD-10-CM | POA: Diagnosis not present

## 2017-11-23 DIAGNOSIS — Z7984 Long term (current) use of oral hypoglycemic drugs: Secondary | ICD-10-CM | POA: Diagnosis not present

## 2018-01-24 DIAGNOSIS — R5383 Other fatigue: Secondary | ICD-10-CM | POA: Diagnosis not present

## 2018-01-24 DIAGNOSIS — R197 Diarrhea, unspecified: Secondary | ICD-10-CM | POA: Diagnosis not present

## 2018-02-06 DIAGNOSIS — D649 Anemia, unspecified: Secondary | ICD-10-CM | POA: Diagnosis not present

## 2018-03-04 DIAGNOSIS — D649 Anemia, unspecified: Secondary | ICD-10-CM | POA: Diagnosis not present

## 2018-05-28 DIAGNOSIS — E119 Type 2 diabetes mellitus without complications: Secondary | ICD-10-CM | POA: Diagnosis not present

## 2018-05-28 DIAGNOSIS — Z23 Encounter for immunization: Secondary | ICD-10-CM | POA: Diagnosis not present

## 2018-05-28 DIAGNOSIS — E7800 Pure hypercholesterolemia, unspecified: Secondary | ICD-10-CM | POA: Diagnosis not present

## 2018-05-28 DIAGNOSIS — Z7984 Long term (current) use of oral hypoglycemic drugs: Secondary | ICD-10-CM | POA: Diagnosis not present

## 2018-08-09 DIAGNOSIS — Z8546 Personal history of malignant neoplasm of prostate: Secondary | ICD-10-CM | POA: Diagnosis not present

## 2018-09-05 DIAGNOSIS — H2513 Age-related nuclear cataract, bilateral: Secondary | ICD-10-CM | POA: Diagnosis not present

## 2018-09-05 DIAGNOSIS — H02831 Dermatochalasis of right upper eyelid: Secondary | ICD-10-CM | POA: Diagnosis not present

## 2018-09-05 DIAGNOSIS — E119 Type 2 diabetes mellitus without complications: Secondary | ICD-10-CM | POA: Diagnosis not present

## 2018-09-05 DIAGNOSIS — H02834 Dermatochalasis of left upper eyelid: Secondary | ICD-10-CM | POA: Diagnosis not present

## 2019-01-22 DIAGNOSIS — E7800 Pure hypercholesterolemia, unspecified: Secondary | ICD-10-CM | POA: Diagnosis not present

## 2019-01-22 DIAGNOSIS — D649 Anemia, unspecified: Secondary | ICD-10-CM | POA: Diagnosis not present

## 2019-01-22 DIAGNOSIS — E119 Type 2 diabetes mellitus without complications: Secondary | ICD-10-CM | POA: Diagnosis not present

## 2019-05-31 DIAGNOSIS — Z23 Encounter for immunization: Secondary | ICD-10-CM | POA: Diagnosis not present

## 2019-07-29 DIAGNOSIS — E119 Type 2 diabetes mellitus without complications: Secondary | ICD-10-CM | POA: Diagnosis not present

## 2019-07-29 DIAGNOSIS — E7800 Pure hypercholesterolemia, unspecified: Secondary | ICD-10-CM | POA: Diagnosis not present

## 2019-07-29 DIAGNOSIS — D649 Anemia, unspecified: Secondary | ICD-10-CM | POA: Diagnosis not present

## 2019-07-29 DIAGNOSIS — Z8546 Personal history of malignant neoplasm of prostate: Secondary | ICD-10-CM | POA: Diagnosis not present

## 2019-10-12 ENCOUNTER — Ambulatory Visit: Payer: Medicare Other | Attending: Internal Medicine

## 2019-10-12 DIAGNOSIS — Z23 Encounter for immunization: Secondary | ICD-10-CM

## 2019-10-12 NOTE — Progress Notes (Signed)
   Covid-19 Vaccination Clinic  Name:  PASON BRADNEY    MRN: [id number] DOB: 07-07-1934  10/12/2019  Mr. Cahn was observed post Covid-19 immunization for 15 minutes without incidence. He was provided with Vaccine Information Sheet and instruction to access the V-Safe system.   Mr. Wiles was instructed to call 911 with any severe reactions post vaccine: Marland Kitchen Difficulty breathing  . Swelling of your face and throat  . A fast heartbeat  . A bad rash all over your body  . Dizziness and weakness    Immunizations Administered    Name Date Dose VIS Date Route   Pfizer COVID-19 Vaccine 10/12/2019 11:01 AM 0.3 mL 08/29/2019 Intramuscular   Manufacturer: [place]   Lot: [id number]   [place]: [id number]

## 2019-11-03 ENCOUNTER — Ambulatory Visit: Payer: Medicare Other | Attending: Internal Medicine

## 2019-11-03 DIAGNOSIS — Z23 Encounter for immunization: Secondary | ICD-10-CM | POA: Insufficient documentation

## 2019-11-03 NOTE — Progress Notes (Signed)
   Covid-19 Vaccination Clinic  Name:  ERYN STALLINS    MRN: [id number] DOB: [birth year]  11/03/2019  Mr. Rentz was observed post Covid-19 immunization for 15 minutes without incidence. He was provided with Vaccine Information Sheet and instruction to access the V-Safe system.   Mr. Lefave was instructed to call 911 with any severe reactions post vaccine: Marland Kitchen Difficulty breathing  . Swelling of your face and throat  . A fast heartbeat  . A bad rash all over your body  . Dizziness and weakness    Immunizations Administered    Name Date Dose VIS Date Route   Pfizer COVID-19 Vaccine 11/03/2019  8:43 AM 0.3 mL 08/29/2019 Intramuscular   Manufacturer: [place]   Lot: [id number]   [place]: [id number]

## 2019-12-02 DIAGNOSIS — Z8546 Personal history of malignant neoplasm of prostate: Secondary | ICD-10-CM | POA: Diagnosis not present

## 2019-12-02 DIAGNOSIS — Z125 Encounter for screening for malignant neoplasm of prostate: Secondary | ICD-10-CM | POA: Diagnosis not present

## 2019-12-02 DIAGNOSIS — E7800 Pure hypercholesterolemia, unspecified: Secondary | ICD-10-CM | POA: Diagnosis not present

## 2019-12-02 DIAGNOSIS — E119 Type 2 diabetes mellitus without complications: Secondary | ICD-10-CM | POA: Diagnosis not present

## 2019-12-02 DIAGNOSIS — D649 Anemia, unspecified: Secondary | ICD-10-CM | POA: Diagnosis not present

## 2020-01-19 DIAGNOSIS — R197 Diarrhea, unspecified: Secondary | ICD-10-CM | POA: Diagnosis not present

## 2020-01-30 DIAGNOSIS — Z8546 Personal history of malignant neoplasm of prostate: Secondary | ICD-10-CM | POA: Diagnosis not present

## 2020-03-18 DIAGNOSIS — H2513 Age-related nuclear cataract, bilateral: Secondary | ICD-10-CM | POA: Diagnosis not present

## 2020-03-18 DIAGNOSIS — E119 Type 2 diabetes mellitus without complications: Secondary | ICD-10-CM | POA: Diagnosis not present

## 2020-03-18 DIAGNOSIS — H02834 Dermatochalasis of left upper eyelid: Secondary | ICD-10-CM | POA: Diagnosis not present

## 2020-03-18 DIAGNOSIS — H02831 Dermatochalasis of right upper eyelid: Secondary | ICD-10-CM | POA: Diagnosis not present

## 2020-06-01 DIAGNOSIS — E7800 Pure hypercholesterolemia, unspecified: Secondary | ICD-10-CM | POA: Diagnosis not present

## 2020-06-01 DIAGNOSIS — E1169 Type 2 diabetes mellitus with other specified complication: Secondary | ICD-10-CM | POA: Diagnosis not present

## 2020-06-01 DIAGNOSIS — Z7984 Long term (current) use of oral hypoglycemic drugs: Secondary | ICD-10-CM | POA: Diagnosis not present

## 2020-06-01 DIAGNOSIS — Z23 Encounter for immunization: Secondary | ICD-10-CM | POA: Diagnosis not present

## 2020-11-29 DIAGNOSIS — E1169 Type 2 diabetes mellitus with other specified complication: Secondary | ICD-10-CM | POA: Diagnosis not present

## 2020-11-29 DIAGNOSIS — Z0000 Encounter for general adult medical examination without abnormal findings: Secondary | ICD-10-CM | POA: Diagnosis not present

## 2020-11-29 DIAGNOSIS — E7800 Pure hypercholesterolemia, unspecified: Secondary | ICD-10-CM | POA: Diagnosis not present

## 2021-03-02 DIAGNOSIS — Z8546 Personal history of malignant neoplasm of prostate: Secondary | ICD-10-CM | POA: Diagnosis not present

## 2021-03-09 DIAGNOSIS — Z8546 Personal history of malignant neoplasm of prostate: Secondary | ICD-10-CM | POA: Diagnosis not present

## 2021-03-23 DIAGNOSIS — H02831 Dermatochalasis of right upper eyelid: Secondary | ICD-10-CM | POA: Diagnosis not present

## 2021-03-23 DIAGNOSIS — H02834 Dermatochalasis of left upper eyelid: Secondary | ICD-10-CM | POA: Diagnosis not present

## 2021-03-23 DIAGNOSIS — H2513 Age-related nuclear cataract, bilateral: Secondary | ICD-10-CM | POA: Diagnosis not present

## 2021-03-23 DIAGNOSIS — E119 Type 2 diabetes mellitus without complications: Secondary | ICD-10-CM | POA: Diagnosis not present

## 2021-03-23 DIAGNOSIS — D492 Neoplasm of unspecified behavior of bone, soft tissue, and skin: Secondary | ICD-10-CM | POA: Diagnosis not present

## 2021-06-06 DIAGNOSIS — Z23 Encounter for immunization: Secondary | ICD-10-CM | POA: Diagnosis not present

## 2021-06-06 DIAGNOSIS — E1169 Type 2 diabetes mellitus with other specified complication: Secondary | ICD-10-CM | POA: Diagnosis not present

## 2021-06-06 DIAGNOSIS — D649 Anemia, unspecified: Secondary | ICD-10-CM | POA: Diagnosis not present

## 2021-06-06 DIAGNOSIS — E7800 Pure hypercholesterolemia, unspecified: Secondary | ICD-10-CM | POA: Diagnosis not present

## 2021-12-02 DIAGNOSIS — Z0000 Encounter for general adult medical examination without abnormal findings: Secondary | ICD-10-CM | POA: Diagnosis not present

## 2021-12-02 DIAGNOSIS — E7800 Pure hypercholesterolemia, unspecified: Secondary | ICD-10-CM | POA: Diagnosis not present

## 2021-12-02 DIAGNOSIS — E1169 Type 2 diabetes mellitus with other specified complication: Secondary | ICD-10-CM | POA: Diagnosis not present

## 2021-12-02 DIAGNOSIS — D649 Anemia, unspecified: Secondary | ICD-10-CM | POA: Diagnosis not present

## 2022-03-01 DIAGNOSIS — Z8546 Personal history of malignant neoplasm of prostate: Secondary | ICD-10-CM | POA: Diagnosis not present

## 2022-03-08 DIAGNOSIS — Z8546 Personal history of malignant neoplasm of prostate: Secondary | ICD-10-CM | POA: Diagnosis not present

## 2022-03-29 DIAGNOSIS — H04123 Dry eye syndrome of bilateral lacrimal glands: Secondary | ICD-10-CM | POA: Diagnosis not present

## 2022-03-29 DIAGNOSIS — H02834 Dermatochalasis of left upper eyelid: Secondary | ICD-10-CM | POA: Diagnosis not present

## 2022-03-29 DIAGNOSIS — H2513 Age-related nuclear cataract, bilateral: Secondary | ICD-10-CM | POA: Diagnosis not present

## 2022-03-29 DIAGNOSIS — H02831 Dermatochalasis of right upper eyelid: Secondary | ICD-10-CM | POA: Diagnosis not present

## 2022-03-29 DIAGNOSIS — D492 Neoplasm of unspecified behavior of bone, soft tissue, and skin: Secondary | ICD-10-CM | POA: Diagnosis not present

## 2022-03-29 DIAGNOSIS — E119 Type 2 diabetes mellitus without complications: Secondary | ICD-10-CM | POA: Diagnosis not present

## 2022-04-11 DIAGNOSIS — D492 Neoplasm of unspecified behavior of bone, soft tissue, and skin: Secondary | ICD-10-CM | POA: Diagnosis not present

## 2022-04-11 DIAGNOSIS — D2310 Other benign neoplasm of skin of unspecified eyelid, including canthus: Secondary | ICD-10-CM | POA: Diagnosis not present

## 2022-06-06 DIAGNOSIS — Z23 Encounter for immunization: Secondary | ICD-10-CM | POA: Diagnosis not present

## 2022-06-06 DIAGNOSIS — D649 Anemia, unspecified: Secondary | ICD-10-CM | POA: Diagnosis not present

## 2022-06-06 DIAGNOSIS — E7800 Pure hypercholesterolemia, unspecified: Secondary | ICD-10-CM | POA: Diagnosis not present

## 2022-06-06 DIAGNOSIS — E1169 Type 2 diabetes mellitus with other specified complication: Secondary | ICD-10-CM | POA: Diagnosis not present

## 2022-09-22 DIAGNOSIS — Z23 Encounter for immunization: Secondary | ICD-10-CM | POA: Diagnosis not present

## 2022-12-07 DIAGNOSIS — Z0000 Encounter for general adult medical examination without abnormal findings: Secondary | ICD-10-CM | POA: Diagnosis not present

## 2022-12-07 DIAGNOSIS — E1169 Type 2 diabetes mellitus with other specified complication: Secondary | ICD-10-CM | POA: Diagnosis not present

## 2022-12-07 DIAGNOSIS — E7800 Pure hypercholesterolemia, unspecified: Secondary | ICD-10-CM | POA: Diagnosis not present

## 2022-12-07 DIAGNOSIS — Z8546 Personal history of malignant neoplasm of prostate: Secondary | ICD-10-CM | POA: Diagnosis not present

## 2022-12-07 DIAGNOSIS — D649 Anemia, unspecified: Secondary | ICD-10-CM | POA: Diagnosis not present

## 2023-03-02 DIAGNOSIS — Z8546 Personal history of malignant neoplasm of prostate: Secondary | ICD-10-CM | POA: Diagnosis not present

## 2023-03-13 DIAGNOSIS — Z8546 Personal history of malignant neoplasm of prostate: Secondary | ICD-10-CM | POA: Diagnosis not present

## 2023-04-18 DIAGNOSIS — E119 Type 2 diabetes mellitus without complications: Secondary | ICD-10-CM | POA: Diagnosis not present

## 2023-04-18 DIAGNOSIS — H02834 Dermatochalasis of left upper eyelid: Secondary | ICD-10-CM | POA: Diagnosis not present

## 2023-04-18 DIAGNOSIS — H2513 Age-related nuclear cataract, bilateral: Secondary | ICD-10-CM | POA: Diagnosis not present

## 2023-04-18 DIAGNOSIS — H04123 Dry eye syndrome of bilateral lacrimal glands: Secondary | ICD-10-CM | POA: Diagnosis not present

## 2023-04-18 DIAGNOSIS — H02831 Dermatochalasis of right upper eyelid: Secondary | ICD-10-CM | POA: Diagnosis not present

## 2023-06-25 DIAGNOSIS — E7800 Pure hypercholesterolemia, unspecified: Secondary | ICD-10-CM | POA: Diagnosis not present

## 2023-06-25 DIAGNOSIS — E1169 Type 2 diabetes mellitus with other specified complication: Secondary | ICD-10-CM | POA: Diagnosis not present

## 2023-06-25 DIAGNOSIS — D649 Anemia, unspecified: Secondary | ICD-10-CM | POA: Diagnosis not present

## 2024-03-12 DIAGNOSIS — C61 Malignant neoplasm of prostate: Secondary | ICD-10-CM | POA: Diagnosis not present

## 2024-04-21 DIAGNOSIS — E119 Type 2 diabetes mellitus without complications: Secondary | ICD-10-CM | POA: Diagnosis not present

## 2024-04-21 DIAGNOSIS — H02831 Dermatochalasis of right upper eyelid: Secondary | ICD-10-CM | POA: Diagnosis not present

## 2024-04-21 DIAGNOSIS — H04123 Dry eye syndrome of bilateral lacrimal glands: Secondary | ICD-10-CM | POA: Diagnosis not present

## 2024-04-21 DIAGNOSIS — H2513 Age-related nuclear cataract, bilateral: Secondary | ICD-10-CM | POA: Diagnosis not present

## 2024-04-21 DIAGNOSIS — H02834 Dermatochalasis of left upper eyelid: Secondary | ICD-10-CM | POA: Diagnosis not present

## 2024-06-09 DIAGNOSIS — E7800 Pure hypercholesterolemia, unspecified: Secondary | ICD-10-CM | POA: Diagnosis not present

## 2024-06-09 DIAGNOSIS — Z130 Encounter for screening for diseases of the blood and blood-forming organs and certain disorders involving the immune mechanism: Secondary | ICD-10-CM | POA: Diagnosis not present

## 2024-06-09 DIAGNOSIS — E1169 Type 2 diabetes mellitus with other specified complication: Secondary | ICD-10-CM | POA: Diagnosis not present

## 2024-06-11 DIAGNOSIS — E1169 Type 2 diabetes mellitus with other specified complication: Secondary | ICD-10-CM | POA: Diagnosis not present

## 2024-06-11 DIAGNOSIS — Z23 Encounter for immunization: Secondary | ICD-10-CM | POA: Diagnosis not present

## 2024-06-11 DIAGNOSIS — Z130 Encounter for screening for diseases of the blood and blood-forming organs and certain disorders involving the immune mechanism: Secondary | ICD-10-CM | POA: Diagnosis not present

## 2024-06-11 DIAGNOSIS — E7800 Pure hypercholesterolemia, unspecified: Secondary | ICD-10-CM | POA: Diagnosis not present

## 2024-06-11 DIAGNOSIS — D649 Anemia, unspecified: Secondary | ICD-10-CM | POA: Diagnosis not present
